# Patient Record
Sex: Female | Born: 1975 | Hispanic: No
Health system: Western US, Academic
[De-identification: ages and names within clinical notes are randomized; demographics above are authoritative.]

## PROBLEM LIST (undated history)

## (undated) DIAGNOSIS — E119 Type 2 diabetes mellitus without complications: Secondary | ICD-10-CM

## (undated) DIAGNOSIS — F329 Major depressive disorder, single episode, unspecified: Secondary | ICD-10-CM

## (undated) DIAGNOSIS — F32A Depression, unspecified: Secondary | ICD-10-CM

## (undated) HISTORY — DX: Type 2 diabetes mellitus without complications: E11.9

## (undated) HISTORY — DX: Depression, unspecified: F32.A

## (undated) HISTORY — PX: INDUCED ABORTION: SHX677

## (undated) HISTORY — DX: Major depressive disorder, single episode, unspecified: F32.9

---

## 2009-08-26 ENCOUNTER — Emergency Department (HOSPITAL_COMMUNITY): Admission: EM | Admit: 2009-08-26 | Discharge: 2009-08-26 | Payer: Self-pay | Admitting: Emergency Medicine

## 2011-03-01 LAB — POCT I-STAT, CHEM 8
BUN: 7 mg/dL (ref 6–23)
Calcium, Ion: 1.01 mmol/L — ABNORMAL LOW (ref 1.12–1.32)
Chloride: 108 mEq/L (ref 96–112)
Creatinine, Ser: 0.4 mg/dL (ref 0.4–1.2)
Glucose, Bld: 128 mg/dL — ABNORMAL HIGH (ref 70–99)
HCT: 39 % (ref 36.0–46.0)
Hemoglobin: 13.3 g/dL (ref 12.0–15.0)
Potassium: 3.9 mEq/L (ref 3.5–5.1)
Sodium: 139 mEq/L (ref 135–145)
TCO2: 21 mmol/L (ref 0–100)

## 2011-03-01 LAB — DIFFERENTIAL
Basophils Absolute: 0.1 10*3/uL (ref 0.0–0.1)
Basophils Relative: 1 % (ref 0–1)
Eosinophils Absolute: 0.1 10*3/uL (ref 0.0–0.7)
Eosinophils Relative: 2 % (ref 0–5)
Lymphocytes Relative: 45 % (ref 12–46)
Lymphs Abs: 2.5 10*3/uL (ref 0.7–4.0)
Monocytes Absolute: 0.5 10*3/uL (ref 0.1–1.0)
Monocytes Relative: 8 % (ref 3–12)
Neutro Abs: 2.5 10*3/uL (ref 1.7–7.7)
Neutrophils Relative %: 44 % (ref 43–77)
Smear Review: ADEQUATE

## 2011-03-01 LAB — URINALYSIS, ROUTINE W REFLEX MICROSCOPIC
Bilirubin Urine: NEGATIVE
Glucose, UA: NEGATIVE mg/dL
Hgb urine dipstick: NEGATIVE
Ketones, ur: NEGATIVE mg/dL
Nitrite: NEGATIVE
Protein, ur: NEGATIVE mg/dL
Specific Gravity, Urine: 1.029 (ref 1.005–1.030)
Urobilinogen, UA: 0.2 mg/dL (ref 0.0–1.0)
pH: 5.5 (ref 5.0–8.0)

## 2011-03-01 LAB — CBC
HCT: 36.9 % (ref 36.0–46.0)
Hemoglobin: 12 g/dL (ref 12.0–15.0)
MCHC: 32.5 g/dL (ref 30.0–36.0)
MCV: 74.9 fL — ABNORMAL LOW (ref 78.0–100.0)
Platelets: 455 10*3/uL — ABNORMAL HIGH (ref 150–400)
RBC: 4.92 MIL/uL (ref 3.87–5.11)
RDW: 17.3 % — ABNORMAL HIGH (ref 11.5–15.5)
WBC: 5.7 10*3/uL (ref 4.0–10.5)

## 2011-03-01 LAB — URINE MICROSCOPIC-ADD ON

## 2011-03-01 LAB — GLUCOSE, CAPILLARY
Glucose-Capillary: 124 mg/dL — ABNORMAL HIGH (ref 70–99)
Glucose-Capillary: 125 mg/dL — ABNORMAL HIGH (ref 70–99)

## 2011-03-01 LAB — POCT PREGNANCY, URINE: Preg Test, Ur: NEGATIVE

## 2015-04-21 ENCOUNTER — Encounter (HOSPITAL_COMMUNITY): Payer: Self-pay | Admitting: Emergency Medicine

## 2015-04-21 ENCOUNTER — Emergency Department (HOSPITAL_COMMUNITY)
Admission: EM | Admit: 2015-04-21 | Discharge: 2015-04-21 | Disposition: A | Discharge status: Home / Self Care | Payer: 59 | Attending: Emergency Medicine | Admitting: Emergency Medicine

## 2015-04-21 DIAGNOSIS — Z3202 Encounter for pregnancy test, result negative: Secondary | ICD-10-CM | POA: Diagnosis not present

## 2015-04-21 DIAGNOSIS — R739 Hyperglycemia, unspecified: Secondary | ICD-10-CM | POA: Diagnosis not present

## 2015-04-21 LAB — URINALYSIS, ROUTINE W REFLEX MICROSCOPIC
Bilirubin Urine: NEGATIVE
Glucose, UA: >1000 mg/dL — AB
Hgb urine dipstick: NEGATIVE
Ketones, ur: NEGATIVE mg/dL
Leukocytes, UA: NEGATIVE
Nitrite: NEGATIVE
Protein, ur: NEGATIVE mg/dL
Specific Gravity, Urine: 1.04 — ABNORMAL HIGH (ref 1.005–1.030)
Urobilinogen, UA: 0.2 mg/dL (ref 0.0–1.0)
pH: 5.5 (ref 5.0–8.0)

## 2015-04-21 LAB — COMPREHENSIVE METABOLIC PANEL
ALT: 19 U/L (ref 14–54)
AST: 23 U/L (ref 15–41)
Albumin: 4 g/dL (ref 3.5–5.0)
Alkaline Phosphatase: 102 U/L (ref 38–126)
Anion gap: 13 (ref 5–15)
BUN: 9 mg/dL (ref 6–20)
CO2: 24 mmol/L (ref 22–32)
Calcium: 9.3 mg/dL (ref 8.9–10.3)
Chloride: 100 mmol/L — ABNORMAL LOW (ref 101–111)
Creatinine, Ser: 0.72 mg/dL (ref 0.44–1.00)
GFR calc Af Amer: >60 mL/min (ref >60)
GFR calc non Af Amer: >60 mL/min (ref >60)
Glucose, Bld: 441 mg/dL — ABNORMAL HIGH (ref 65–99)
Potassium: 4 mmol/L (ref 3.5–5.1)
Sodium: 137 mmol/L (ref 135–145)
Total Bilirubin: 0.2 mg/dL — ABNORMAL LOW (ref 0.3–1.2)
Total Protein: 7.6 g/dL (ref 6.5–8.1)

## 2015-04-21 LAB — CBG MONITORING, ED
Glucose-Capillary: 442 mg/dL — ABNORMAL HIGH (ref 65–99)
Glucose-Capillary: 309 mg/dL — ABNORMAL HIGH (ref 65–99)

## 2015-04-21 LAB — CBC
HCT: 36.8 % (ref 36.0–46.0)
Hemoglobin: 11.6 g/dL — ABNORMAL LOW (ref 12.0–15.0)
MCH: 24.7 pg — ABNORMAL LOW (ref 26.0–34.0)
MCHC: 31.5 g/dL (ref 30.0–36.0)
MCV: 78.5 fL (ref 78.0–100.0)
Platelets: 361 10*3/uL (ref 150–400)
RBC: 4.69 MIL/uL (ref 3.87–5.11)
RDW: 14.6 % (ref 11.5–15.5)
WBC: 5.3 10*3/uL (ref 4.0–10.5)

## 2015-04-21 LAB — PREGNANCY, URINE: Preg Test, Ur: NEGATIVE

## 2015-04-21 LAB — URINE MICROSCOPIC-ADD ON

## 2015-04-21 MED ORDER — METFORMIN HCL 500 MG PO TABS
500.0000 mg | ORAL_TABLET | Freq: Once | ORAL | Status: AC
  Administered 2015-04-21: 500 mg via ORAL
  Filled 2015-04-21: qty 1

## 2015-04-21 MED ORDER — SODIUM CHLORIDE 0.9 % IV BOLUS (SEPSIS)
2000.0000 mL | Freq: Once | INTRAVENOUS | Status: AC
  Administered 2015-04-21: 2000 mL via INTRAVENOUS

## 2015-04-21 MED ORDER — METFORMIN HCL 500 MG PO TABS: 500.0000 mg | ORAL_TABLET | Freq: Two times a day (BID) | ORAL | Status: AC

## 2015-04-21 NOTE — ED Provider Notes (Signed)
CSN: [id number]     Arrival date & time 04/21/15  1010 History   First MD Initiated Contact with Patient 04/21/15 1035     Chief Complaint  Patient presents with  . Hyperglycemia    new      HPI Patient presents to the emergency department after being noted to have elevated blood sugar at the OB/GYN office today.  She states she was having a routine visit to get birth control pills and described her intermittent lightheadedness as well as polyuria and polydipsia.  Blood sugar was noted to be greater than 400 was sent to the ER for evaluation.  No prior history of diabetes.  She does admit to polyuria and polydipsia.  No fevers or chills.  No chest pain.  Denies nausea vomiting.  No diarrhea.  No urinary complaints.   History reviewed. No pertinent past medical history. Past Surgical History  Procedure Laterality Date  . Induced abortion     History reviewed. No pertinent family history. History  Substance Use Topics  . Smoking status: Never Smoker   . Smokeless tobacco: Not on file  . Alcohol Use: No   OB History    No data available     Review of Systems  All other systems reviewed and are negative.     Allergies  Review of patient's allergies indicates no known allergies.  Home Medications   Prior to Admission medications   Not on File   BP 126/87 mmHg  Pulse 89  Temp(Src) 97.5 F (36.4 C) (Oral)  Resp 19  SpO2 96%  LMP 03/31/2015 (Exact Date) Physical Exam  Constitutional: She is oriented to person, place, and time. She appears well-developed and well-nourished. No distress.  HENT:  Head: Normocephalic and atraumatic.  Eyes: EOM are normal.  Neck: Normal range of motion.  Cardiovascular: Normal rate, regular rhythm and normal heart sounds.   Pulmonary/Chest: Effort normal and breath sounds normal.  Abdominal: Soft. She exhibits no distension. There is no tenderness.  Musculoskeletal: Normal range of motion.  Neurological: She is alert and oriented to  person, place, and time.  Skin: Skin is warm and dry.  Psychiatric: She has a normal mood and affect. Judgment normal.  Nursing note and vitals reviewed.   ED Course  Procedures (including critical care time) Labs Review Labs Reviewed  CBC - Abnormal; Notable for the following:    Hemoglobin 11.6 (*)    MCH 24.7 (*)    All other components within normal limits  COMPREHENSIVE METABOLIC PANEL - Abnormal; Notable for the following:    Chloride 100 (*)    Glucose, Bld 441 (*)    Total Bilirubin 0.2 (*)    All other components within normal limits  URINALYSIS, ROUTINE W REFLEX MICROSCOPIC (NOT AT [facility]) - Abnormal; Notable for the following:    Specific Gravity, Urine 1.040 (*)    Glucose, UA >1000 (*)    All other components within normal limits  CBG MONITORING, ED - Abnormal; Notable for the following:    Glucose-Capillary 442 (*)    All other components within normal limits  CBG MONITORING, ED - Abnormal; Notable for the following:    Glucose-Capillary 309 (*)    All other components within normal limits  PREGNANCY, URINE  URINE MICROSCOPIC-ADD ON  HEMOGLOBIN A1C    Imaging Review No results found.   EKG Interpretation None      MDM   Final diagnoses:  None   Elevated blood sugar.  Hemoglobin A1c sent.  She will need a primary care physician.  I will start the patient on metformin in the meantime.  Blood sugar improved with IV fluids.  She's feeling better after IV fluids.  Patient understands to return to the emergency department for new or worsening symptoms     Jola Schmidt, MD 04/21/15 1356

## 2015-04-21 NOTE — Discharge Instructions (Signed)
High Blood Sugar High blood sugar (hyperglycemia) means that the level of sugar in your blood is higher than it should be. Signs of high blood sugar include:  Feeling thirsty.  Frequent peeing (urinating).  Feeling tired or sleepy.  Dry mouth.  Vision changes.  Feeling weak.  Feeling hungry but losing weight.  Numbness and tingling in your hands or feet.  Headache. When you ignore these signs, your blood sugar may keep going up. These problems may get worse, and other problems may begin. HOME CARE  Check your blood sugars as told by your doctor. Write down the numbers with the date and time.  Take the right amount of insulin or diabetes pills at the right time. Write down the dose with date and time.  Refill your insulin or diabetes pills before running out.  Watch what you eat. Follow your meal plan.  Drink liquids without sugar, such as water. Check with your doctor if you have kidney or heart disease.  Follow your doctor's orders for exercise. Exercise at the same time of day.  Keep your doctor's appointments. GET HELP RIGHT AWAY IF:   You have trouble thinking or are confused.  You have fast breathing with fruity smelling breath.  You pass out (faint).  You have 2 to 3 days of high blood sugars and you do not know why.  You have chest pain.  You are feeling sick to your stomach (nauseous) or throwing up (vomiting).  You have sudden vision changes. MAKE SURE YOU:   Understand these instructions.  Will watch your condition.  Will get help right away if you are not doing well or get worse. Document Released: 09/09/2009 Document Revised: 02/04/2012 Document Reviewed: 09/09/2009 [facility] Patient Information 2015 [place], [place]. This information is not intended to replace advice given to you by your health care provider. Make sure you discuss any questions you have with your health care provider.  Emergency Department Resource Guide 1) Find a Doctor and  Pay Out of Pocket Although you won't have to find out who is covered by your insurance plan, it is a good idea to ask around and get recommendations. You will then need to call the office and see if the doctor you have chosen will accept you as a new patient and what types of options they offer for patients who are self-pay. Some doctors offer discounts or will set up payment plans for their patients who do not have insurance, but you will need to ask so you aren't surprised when you get to your appointment.  2) Contact Your Local Health Department Not all health departments have doctors that can see patients for sick visits, but many do, so it is worth a call to see if yours does. If you don't know where your local health department is, you can check in your phone book. The CDC also has a tool to help you locate your state's health department, and many state websites also have listings of all of their local health departments.  3) Find a [facility] If your illness is not likely to be very severe or complicated, you may want to try a walk in clinic. These are popping up all over the country in pharmacies, drugstores, and shopping centers. They're usually staffed by nurse practitioners or physician assistants that have been trained to treat common illnesses and complaints. They're usually fairly quick and inexpensive. However, if you have serious medical issues or chronic medical problems, these are probably not your best  option.  No Primary Care Doctor: - Call Health Connect at  [phone] - they can help you locate a primary care doctor that  accepts your insurance, provides certain services, etc. - Physician Referral Service- [phone]  Chronic Pain Problems: Organization         Address  Phone   Notes  [facility]  [phone] Patients need to be referred by their primary care doctor.   Medication Assistance: Organization         Address  Phone   Notes  [facility] Medication [facility] [address] [place], [place] [address] [phone] --Must be a resident of [facility] -- Must have NO insurance coverage whatsoever (no Medicaid/ Medicare, etc.) -- The pt. MUST have a primary care doctor that directs their care regularly and follows them in the community   MedAssist  [phone]   [employer]  [phone]    Agencies that provide inexpensive medical care: Organization         Address  Phone   Notes  [place]  [phone]   [facility]    [phone]   [facility], [place] [address] [phone]   [place] [address] [place] [phone]   Planned Parenthood    [phone]   [facility]    [phone]   [place] and [address], [place] Phone:  [phone], Fax:  [phone] Hours of Operation:  9 am - 6 pm, M-F.  Also accepts Medicaid/Medicare and self-pay.  [facility] for [place] [address], [address], [place] Phone: [phone], Fax: [phone]. Hours of Operation:  8:30 am - 5:30 pm, M-F.  Also accepts Medicaid and self-pay.  [facility] [place] [address], [place] Phone: [phone]   [place], [place], [place] [phone], Ext. [phone] Mondays & Thursdays: 7-9 AM.  First 15 patients are seen on a first come, first serve basis.    [place] Providers:  Organization         Address  Phone   Notes  [facility] [address], [place] [phone] Also accepts self-pay patients.  [facility] [address] [place], [place]  [phone]   [place], [address], [place] [phone]   [facility] [address] [place] [phone]   Lucianne Lei [address], [place]   [phone] Only accepts [place] Access [place] patients after they have their name applied to their card.   Self-Pay (no insurance) in [facility]:  Organization         Address  Phone   Notes  Sickle Cell Patients, [facility] Internal [facility] [place] [phone]   [facility] [place] [phone]   [facility] [place]  [place], [place], [place] [phone]   [facility]/Dr. Osei-Bonsu  [address] [place] or [address], [place] [phone] Phone number for both [place] and [place] locations is the same.  [facility] [address], [place] (  [place]) [phone]   [place], [place] or [address] [phone]   [facility] [address] [place] [phone], phone; [phone], fax Sees patients 1st and 3rd Saturday of every month.  Must not qualify for public or private insurance (i.e. Medicaid, Medicare, [place] Health Choice, Veterans' Benefits)  Household income should be no more than 200% of the poverty level The clinic cannot treat you if you are pregnant or think you are pregnant  Sexually transmitted diseases are not treated at the clinic.    Dental Care: Organization         Address  Phone  Notes  [facility] Department of [facility] [place] [phone] Accepts children up to age 45 who are enrolled in [place] or [place]; pregnant women with a Medicaid card; and children who have applied for Medicaid or [place] Health Choice, but were declined, whose parents can pay a reduced fee at time of service.  [facility] Department of [facility], [place]  [address], [place] [phone] Accepts children up to age 41 who are enrolled in [place] or [place]; pregnant women with a Medicaid card; and children who have applied for Medicaid or [place] Health Choice, but were declined, whose parents can pay a reduced fee at time of service.  [place] Adult Dental Access PROGRAM  [place] [phone] Patients are seen by appointment only. Walk-ins are not accepted. [place] will see patients 21 years of age and older. Monday - Tuesday (8am-5pm) Most Wednesdays (8:30-5pm) $30 per visit, cash only  [facility] Adult Dental Access PROGRAM  [address], [facility] [phone] Patients are seen by appointment only. Walk-ins are not accepted. [place] will see patients 60 years of age and older. One Wednesday Evening (Monthly: Volunteer Based).  $30 per visit, cash only  [place]  [phone] for adults; Children under age 70, call Graduate Pediatric Dentistry at [phone]. Children aged 14-14, please call [phone] to request a pediatric application.  Dental services are provided in all areas of dental care including fillings, crowns and bridges, complete and partial dentures, implants, gum treatment, root canals, and extractions. Preventive care is also provided. Treatment is provided to both adults and children. Patients are selected via a lottery and there is often a waiting list.   [facility] [address] [place]  [phone] [email or web address]   [facility] [address] [place], [place] [phone], Ext. [phone] Second and Fourth Thursday of each month, opens at 6:30 AM; Clinic ends at 9 AM.  Patients are seen on a first-come first-served basis, and a limited number are seen during each clinic.   [facility]  [address] Hillard Danker [place], [place] [phone]   Eligibility Requirements You must have lived in [place], [place], or  [place] counties for at least the last three months.   You cannot be eligible for state or federal sponsored [employer], including [employer], [place], or [employer].   You generally cannot be eligible for healthcare insurance through your employer.    How to apply: Eligibility screenings are held every Tuesday and Wednesday afternoon from 1:00 pm until 4:00 pm. You do not need an appointment for the interview!  [facility] [address], [place],  [place] Department  [place] Department  [place]  [phone]    Behavioral Health Resources in the Community: Intensive Outpatient Programs Organization         Address  Phone  Notes  [place]. [address], [place], [place] [phone]   [facility] Outpatient [address] [place], [place]   ADS: Alcohol & Drug Svcs [address] [place], [place]   [place] [address]  [place], [place] or [phone]   Substance Abuse Resources Organization         Address  Phone  Notes  Alcohol and Drug Services  [phone]   [place]  [phone]   [place]   Chinita Pester  [phone]   Residential & Outpatient Substance Abuse Program  [phone]   Psychological Services Organization         Address  Phone  Notes  [facility] [place]  [place]  [phone]   [place] [address] [place] or [phone]    Mobile Crisis Teams Organization         Address  Phone  Notes  [facility], Mobile Crisis Care Unit  [phone]   [facility]  [address] [place], [place]   [place] [address] [place] [phone]    Self-Help/Support Groups Organization         Address  Phone             Notes  [place]. of [place] - variety of support groups  [place] Call for more information  Narcotics Anonymous (NA), [facility] [address], [employer] [place]  2 meetings at this location   Special educational needs teacher         Address  Phone  Notes  [facility] Residential Treatment [place],    [place]  [phone]   [facility]  [address], [place] [phone], [place], [place]   [place], [place] [phone] Admissions: 8am-3pm M-F  Incentives Substance [place] [address],    [place], [place] [phone]   [facility] [address] [place], [place], [place]   [facility] [address]  [place], [place]   [facility]ight Programs - Intensive Outpatient [address] Kristeen [address], [place], [place]   [facility] ([place].,  [place], [place] [phone] or [phone]   [facility] Services (RTS) [address], [place], [place] Accepts Medicaid  [facility] [place] [address],  [place] [phone] Substance Abuse/Addiction Treatment   [facility] [place] [facility] Organization         Address  Phone  Notes  [facility]  [phone]   Domenic Schwab, PhD [address] Arlis Porta [place], [place]   [phone] or [phone]   [place], [place] [phone]   [place] [address] [place], [place] [phone] Insurance/Medicaid/sponsorship through [employer] and Families [address] [place] [address]  [place], [place] [phone] [place], [place] [phone]     Dr. Adele Schilder  [phone]   [facility] [place] Dept. 1) [address] [place] 2) [place] 3)  [place] [address], [place] [phone]  [phone]   [place] [phone] or [phone] (After Hours)

## 2015-04-21 NOTE — ED Notes (Signed)
Per EMS- was at [facility] office and CBG was found to be 431 mg/dl. Wanted patient transported to nearest hospital. Patient has never been diagnosed with DM. Recent issues with dizziness. VSS. BP 150/90 HR 72 RR 18.

## 2015-04-21 NOTE — ED Notes (Signed)
Bed: WA17 Expected date:  Expected time:  Means of arrival:  Comments: EMS/hyperglycemia 

## 2015-04-21 NOTE — ED Notes (Signed)
RN STARTING IV AND DRAWING LABS PER RN

## 2015-04-22 ENCOUNTER — Telehealth (HOSPITAL_BASED_OUTPATIENT_CLINIC_OR_DEPARTMENT_OTHER): Payer: Self-pay | Admitting: Emergency Medicine

## 2015-04-22 LAB — HEMOGLOBIN A1C
Hgb A1c MFr Bld: 12.8 % — ABNORMAL HIGH (ref 4.8–5.6)
Mean Plasma Glucose: 321 mg/dL

## 2015-05-12 ENCOUNTER — Encounter: Payer: 59 | Attending: Endocrinology

## 2015-05-12 VITALS — Ht 67.0 in | Wt 248.3 lb

## 2015-05-12 DIAGNOSIS — E119 Type 2 diabetes mellitus without complications: Secondary | ICD-10-CM

## 2015-05-12 DIAGNOSIS — Z713 Dietary counseling and surveillance: Secondary | ICD-10-CM | POA: Insufficient documentation

## 2015-05-12 NOTE — Progress Notes (Signed)
Patient was seen on 05/12/15 for the first of a series of three diabetes self[facility]management [facility]  Patient Education Plan per assessed needs and concerns is to attend four course education program for Diabetes Self Management Education.  The following learning objectives were met by the patient during this class:  Describe diabetes  State some common risk factors for diabetes  Defines the role of glucose and insulin  Identifies type of diabetes and pathophysiology  Describe the relationship between diabetes and cardiovascular risk  State the members of the Healthcare Team  States the rationale for glucose monitoring  State when to test glucose  State their individual Target Range  State the importance of logging glucose readings  Describe how to interpret glucose readings  Identifies A1C target  Explain the correlation between A1c and eAG values  State symptoms and treatment of high blood glucose  State symptoms and treatment of low blood glucose  Explain proper technique for glucose testing  Identifies proper sharps disposal  Handouts given during class include:  Living Well with Diabetes book  Carb Counting and Meal Planning book  Meal Plan Card  Carbohydrate guide  Meal planning worksheet  Low Sodium Flavoring Tips  The diabetes portion plate  X5F to eAG Conversion Chart  Diabetes Medications  Diabetes Recommended Care Schedule  Support Group  Diabetes Success Plan  Core Class Satisfaction Survey  Follow-Up Plan:  Attend core 2

## 2015-05-19 DIAGNOSIS — E119 Type 2 diabetes mellitus without complications: Secondary | ICD-10-CM

## 2015-05-19 NOTE — Progress Notes (Signed)

## 2015-05-26 DIAGNOSIS — E119 Type 2 diabetes mellitus without complications: Secondary | ICD-10-CM

## 2015-05-31 NOTE — Progress Notes (Signed)
  Patient was seen on 05/26/2015 for the third of a series of three diabetes self-management courses [facility]. The following learning objectives were met by the patient during this course:  . Describe how diabetes changes over time . Identify diabetes complications and ways to prevent them . Describe strategies that can promote heart health including lowering blood pressure and cholesterol . Describe strategies to lower dietary fat and sodium in the diet . Identify physical activities that benefit cardiovascular health . Evaluate success in meeting personal goal . Describe the belief that they can live successfully with diabetes day to day . Establish 2-3 goals that they will plan to diligently work on until they return for the free 66-monthfollow-up visit  The following handouts were given in class:  3 Month Follow Up Visit handout  Goal setting handout  Class evaluation form  Your patient has established the following 3 month goals for diabetes self-care:  Count Carbohydrates for meals and snacks  Be active fore 30 minutes or more 3 x week  Reduce my intake of unhealthy fat by eating less fast food  Follow-Up Plan: Patient will attend a 3 month follow-up visit for diabetes self-management education.

## 2017-11-26 DIAGNOSIS — C50919 Malignant neoplasm of unspecified site of unspecified female breast: Secondary | ICD-10-CM

## 2017-11-26 HISTORY — DX: Malignant neoplasm of unspecified site of unspecified female breast: C50.919

## 2018-06-02 ENCOUNTER — Other Ambulatory Visit (HOSPITAL_COMMUNITY): Payer: Self-pay | Admitting: *Deleted

## 2018-06-02 DIAGNOSIS — N6310 Unspecified lump in the right breast, unspecified quadrant: Secondary | ICD-10-CM

## 2018-06-24 ENCOUNTER — Other Ambulatory Visit (HOSPITAL_COMMUNITY): Payer: Self-pay | Admitting: Obstetrics and Gynecology

## 2018-06-24 ENCOUNTER — Ambulatory Visit
Admission: RE | Admit: 2018-06-24 | Discharge: 2018-06-24 | Disposition: A | Discharge status: Home / Self Care | Payer: 59 | Source: Ambulatory Visit | Attending: Obstetrics and Gynecology | Admitting: Obstetrics and Gynecology

## 2018-06-24 ENCOUNTER — Encounter (HOSPITAL_COMMUNITY): Payer: Self-pay

## 2018-06-24 ENCOUNTER — Ambulatory Visit
Admission: RE | Admit: 2018-06-24 | Discharge: 2018-06-24 | Disposition: A | Discharge status: Home / Self Care | Payer: PRIVATE HEALTH INSURANCE | Source: Ambulatory Visit | Attending: Obstetrics and Gynecology | Admitting: Obstetrics and Gynecology

## 2018-06-24 ENCOUNTER — Ambulatory Visit (HOSPITAL_COMMUNITY)
Admission: RE | Admit: 2018-06-24 | Discharge: 2018-06-24 | Disposition: A | Discharge status: Home / Self Care | Payer: Self-pay | Source: Ambulatory Visit | Attending: Obstetrics and Gynecology | Admitting: Obstetrics and Gynecology

## 2018-06-24 ENCOUNTER — Ambulatory Visit
Admission: RE | Admit: 2018-06-24 | Discharge: 2018-06-24 | Disposition: A | Discharge status: Home / Self Care | Payer: Self-pay | Source: Ambulatory Visit | Attending: Obstetrics and Gynecology | Admitting: Obstetrics and Gynecology

## 2018-06-24 VITALS — BP 114/76 | Ht 67.0 in

## 2018-06-24 DIAGNOSIS — Z1239 Encounter for other screening for malignant neoplasm of breast: Secondary | ICD-10-CM

## 2018-06-24 DIAGNOSIS — N6310 Unspecified lump in the right breast, unspecified quadrant: Secondary | ICD-10-CM

## 2018-06-24 DIAGNOSIS — N6311 Unspecified lump in the right breast, upper outer quadrant: Secondary | ICD-10-CM

## 2018-06-24 NOTE — Progress Notes (Signed)
Complaints of a right breast lump x 3 weeks.  Pap Smear: Pap smear not completed today. Last [facility] smear was in 2017 at [facility] and normal per patient. Per patient has no history of an abnormal Pap smear. No Pap smear results are in Epic.  Physical exam: Breasts Left breast slightly larger than right breast. No skin abnormalities bilateral breasts. No nipple retraction bilateral breasts. No nipple discharge bilateral breasts. No lymphadenopathy. No lumps palpated left breast. Palpated a lump within the right breast at 10 o'clock 7 cm from the nipple. No complaints of pain or tenderness on exam. Referred patient to the [facility] for a diagnostic mammogram and right breast ultrasound. Appointment scheduled for Tuesday, June 24, 2018 at 1410.        Pelvic/Bimanual No Pap smear completed today since last Pap smear was in 2017 per patient. Pap smear not indicated per BCCCP guidelines.   Smoking History: Patient has never smoked.  Patient Navigation: Patient education provided. Access to services provided for patient through BCCCP program.   Breast and Cervical Cancer Risk Assessment: Patient has no family history of breast cancer, known genetic mutations, or radiation treatment to the chest before age 69. Patient has no history of cervical dysplasia, immunocompromised, or DES exposure in-utero.  Risk Assessment    Risk Scores      06/24/2018   Last edited by: Armond Hang, LPN   5-year risk: 0.6 %   Lifetime risk: 9.6 %

## 2018-06-24 NOTE — Patient Instructions (Signed)
Explained breast self awareness with Paula Libra. Patient did not need a Pap smear today due to last Pap smear was in 2017 per patient. Let her know BCCCP will cover Pap smears every 3 years unless has a history of abnormal Pap smears. Reminded patient that her next Pap smear will be due next year and that it is covered through [employer]. Told patient to call BCCCP to schedule. Referred patient to the [facility] for a diagnostic mammogram and right breast ultrasound. Appointment scheduled for Tuesday, June 24, 2018 at 1410. Kesley Mullens verbalized understanding.  Shantay Sonn, [name], RN 1:45 PM

## 2018-06-25 ENCOUNTER — Telehealth: Payer: Self-pay | Admitting: Oncology

## 2018-06-25 NOTE — Telephone Encounter (Signed)
Spoke with patient to confirm afternoon [facility] clinic for 8/7, packet emailed to patient

## 2018-06-26 ENCOUNTER — Encounter: Payer: Self-pay | Admitting: *Deleted

## 2018-06-27 ENCOUNTER — Other Ambulatory Visit: Payer: Self-pay | Admitting: *Deleted

## 2018-06-27 ENCOUNTER — Encounter (HOSPITAL_COMMUNITY): Payer: Self-pay | Admitting: *Deleted

## 2018-06-27 DIAGNOSIS — Z171 Estrogen receptor negative status [ER-]: Principal | ICD-10-CM

## 2018-06-27 DIAGNOSIS — C50411 Malignant neoplasm of upper-outer quadrant of right female breast: Secondary | ICD-10-CM

## 2018-06-30 ENCOUNTER — Other Ambulatory Visit (HOSPITAL_COMMUNITY): Payer: Self-pay | Admitting: *Deleted

## 2018-06-30 DIAGNOSIS — Z0000 Encounter for general adult medical examination without abnormal findings: Secondary | ICD-10-CM

## 2018-07-01 NOTE — Addendum Note (Signed)
Addended by: Tasia Catchings R on: 07/01/2018 12:46 PM   Modules accepted: Orders

## 2018-07-01 NOTE — Addendum Note (Signed)
Addended by: Tasia Catchings R on: 07/01/2018 12:45 PM   Modules accepted: Orders

## 2018-07-01 NOTE — Progress Notes (Signed)
[place]  [phone] [phone]     ID: Tara Gomez DOB: [birth year]  MR#: [id number]  FWY#:[id number]  Patient Care Team: Marylynn Pearson, MD as PCP - General (Obstetrics and Gynecology) Erroll Luna, MD as Consulting Physician (General Surgery) Lanier Felty, Virgie Dad, MD as Consulting Physician (Oncology) Kyung Rudd, MD as Consulting Physician (Radiation Oncology) OTHER MD:  CHIEF COMPLAINT: Estrogen receptor negative breast cancer  CURRENT TREATMENT: Neoadjuvant chemotherapy   HISTORY OF CURRENT ILLNESS: Tara Gomez palpated a mass in her right breast in early July 2019. She brought it to medical attention and underwent bilateral diagnostic mammography with tomography and right breast ultrasonography at [facility] on 06/24/2018 showing: breast density category B. There is a hypoechoic mass at the 10 o' clock right breast upper outer quadrant located 7 cm from the nipple. There were no suspicious lymph nodes in the right axilla.   Accordingly on 06/24/2018 she proceeded to biopsy of the right breast area in question. The pathology from this procedure showed ([id number]): Invasive ductal carcinoma, grade III. Prognostic indicators significant for: both estrogen receptor and progesterone receptor, 0% negative. Proliferation marker Ki67 at 80%. HER2 amplified  with ratios HER2/CEP17 signals 3.28 and average HER2 copies per cell 4.10  The patient's subsequent history is as detailed below.  INTERVAL HISTORY: Tara Gomez was evaluated in the multidisciplinary breast cancer clinic on 07/02/2018. Her case was also presented at the multidisciplinary breast cancer conference on the same day. At that time a preliminary plan was proposed: Neoadjuvant chemotherapy and immunotherapy followed by breast conserving surgery and sentinel lymph node sampling and radiation, genetics testing.   REVIEW OF SYSTEMS: She notes that she is taking 10,000 mcg of biotin due  to dysmenorrhea. She notes that the biotin prevents her from having significant perimenstrual pain. She also followed up with another doctor for her diabetes, but she has not seen that doctor for many years. She goes to [employer] for exercise. The patient denies unusual headaches, visual changes, nausea, vomiting, stiff neck, dizziness, or gait imbalance. There has been no cough, phlegm production, or pleurisy, no chest pain or pressure, and no change in bowel or bladder habits. The patient denies fever, rash, bleeding, unexplained fatigue or unexplained weight loss. A detailed review of systems was otherwise entirely negative.    PAST MEDICAL HISTORY: Past Medical History:  Diagnosis Date  . Depression   . Diabetes mellitus without complication ([place])   Dysmenorrhea   PAST SURGICAL HISTORY: Past Surgical History:  Procedure Laterality Date  . INDUCED ABORTION      FAMILY HISTORY Family History  Problem Relation Age of Onset  . Diabetes Mother   . Hypertension Mother   . Diabetes Paternal Grandmother   . Breast cancer Neg Hx   The patient's father died at age 46 due to liver cancer. The patient's mother is alive at age 54 as of August 2019. The patient has 1 brother and 4 sisters. The patient denies a family history of breast or ovarian cancer in the family.   GYNECOLOGIC HISTORY:  Patient's last menstrual period was 06/17/2018 (approximate). Menarche: 42 years old The patient has not carried a child to term.  She is having regular monthly periods that last 3 days and are not heavy, if she takes biotin. She used ortho tri-cyclen for birth control.     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 The patient's SO  recently passed away from renal failure.  Currently at home is the patient's sister visiting from [place] state and their mother who moved from [place]. The patient notes that this is a  temporary arrangement.  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 The patient's mother can be reached at [phone].   HEALTH MAINTENANCE: Social History   Tobacco Use  . Smoking status: Never Smoker  . Smokeless tobacco: Never Used  Substance Use Topics  . Alcohol use: No  . Drug use: Not Currently     Colonoscopy:  PAP: December 2016  Bone density:   No Known Allergies  Current Outpatient Medications  Medication Sig Dispense Refill  . metFORMIN (GLUCOPHAGE) 500 MG tablet Take 1 tablet (500 mg total) by mouth 2 (two) times daily with a meal. (Patient not taking: Reported on 06/24/2018) 60 tablet 0   No current facility-administered medications for this visit.     OBJECTIVE: Morbidly obese African-American woman who appears stated age  42:   07/02/18 1248  BP: 115/77  Pulse: 72  Resp: 18  Temp: 98.5 F (36.9 C)  SpO2: 100%     Body mass index is 40.53 kg/m.   Wt Readings from Last 3 Encounters:  07/02/18 258 lb 12.8 oz (117.4 kg)  05/12/15 248 lb 4.8 oz (112.6 kg)      ECOG FS:1 - Symptomatic but completely ambulatory  Ocular: Sclerae unicteric, pupils round and equal Ear-nose-throat: Oropharynx clear and moist Lymphatic: No cervical or supraclavicular adenopathy Lungs no rales or rhonchi Heart regular rate and rhythm Abd soft, obese, nontender, positive bowel sounds MSK no focal spinal tenderness, no joint edema Neuro: non-focal, well-oriented, appropriate affect Breasts: I palpate an area of irregularity in the upper outer quadrant of the right breast which is not well-defined today.  There are no skin or nipple changes of concern.  The left breast is benign.  Both axillae are benign.   LAB RESULTS:  CMP     Component Value Date/Time   NA 139 07/02/2018 1218   K 4.1 07/02/2018 1218   CL 103 07/02/2018 1218    CO2 25 07/02/2018 1218   GLUCOSE 226 (H) 07/02/2018 1218   BUN 7 07/02/2018 1218   CREATININE 0.85 07/02/2018 1218   CALCIUM 8.7 (L) 07/02/2018 1218   PROT 7.0 07/02/2018 1218   ALBUMIN 3.4 (L) 07/02/2018 1218   AST 17 07/02/2018 1218   ALT 13 07/02/2018 1218   ALKPHOS 88 07/02/2018 1218   BILITOT 0.3 07/02/2018 1218   GFRNONAA >60 07/02/2018 1218   GFRAA >60 07/02/2018 1218    No results found for: TOTALPROTELP, ALBUMINELP, A1GS, A2GS, BETS, BETA2SER, GAMS, MSPIKE, SPEI  No results found for: KPAFRELGTCHN, LAMBDASER, KAPLAMBRATIO  Lab Results  Component Value Date   WBC 6.1 07/02/2018   NEUTROABS 3.2 07/02/2018   HGB 11.3 (L) 07/02/2018   HCT 35.6 07/02/2018   MCV 81.3 07/02/2018   PLT 312 07/02/2018    _0 @  No results found for: LABCA2  No components found for: [id number]  No results for input(s): INR in the last 168 hours.  No results found for: LABCA2  No results found for: WGN562  No results found for: ZHY865  No results found for: HQI696  No results found for: CA2729  No components found for: HGQUANT  No results found for: CEA1 /  No results found for: CEA1   No results found for: AFPTUMOR  No results found for: [place]  No results found for: PSA1  Appointment on 07/02/2018  Component Date Value Ref Range Status  . Cholesterol 07/02/2018 154  0 - 200 mg/dL Final  . Triglycerides 07/02/2018 171* <150 mg/dL Final  . HDL 07/02/2018 52  >40 mg/dL Final  . Total CHOL/HDL Ratio 07/02/2018 3.0  RATIO Final  . VLDL 07/02/2018 34  0 - 40 mg/dL Final  . LDL Cholesterol 07/02/2018 68  0 - 99 mg/dL Final   Comment:        Total Cholesterol/HDL:CHD Risk Coronary Heart Disease Risk Table                     Men   Women  1/2 Average Risk   3.4   3.3  Average Risk       5.0   4.4  2 X Average Risk   9.6   7.1  3 X Average Risk  23.4   11.0        Use the calculated Patient Ratio above and the CHD Risk Table to determine the patient's  CHD Risk.        ATP III CLASSIFICATION (LDL):  <100     mg/dL   Optimal  100-129  mg/dL   Near or Above                    Optimal  130-159  mg/dL   Borderline  160-189  mg/dL   High  >190     mg/dL   Very High Performed at [facility] [address] [place], [place] [address]   . Hgb A1c MFr Bld 07/02/2018 8.9* 4.8 - 5.6 % Final   Comment: (NOTE) Pre diabetes:          5.7%-6.4% Diabetes:              >6.4% Glycemic control for   <7.0% adults with diabetes   . Mean Plasma Glucose 07/02/2018 208.73  mg/dL Final   Performed at [facility] [address], [place], [place] [address]  . Sodium 07/02/2018 139  135 - 145 mmol/L Final  . Potassium 07/02/2018 4.1  3.5 - 5.1 mmol/L Final  . Chloride 07/02/2018 103  98 - 111 mmol/L Final  . CO2 07/02/2018 25  22 - 32 mmol/L Final  . Glucose, Bld 07/02/2018 226* 70 - 99 mg/dL Final  . BUN 07/02/2018 7  6 - 20 mg/dL Final  . Creatinine 07/02/2018 0.85  0.44 - 1.00 mg/dL Final  . Calcium 07/02/2018 8.7* 8.9 - 10.3 mg/dL Final  . Total Protein 07/02/2018 7.0  6.5 - 8.1 g/dL Final  . Albumin 07/02/2018 3.4* 3.5 - 5.0 g/dL Final  . AST 07/02/2018 17  15 - 41 U/L Final  . ALT 07/02/2018 13  0 - 44 U/L Final  . Alkaline Phosphatase 07/02/2018 88  38 - 126 U/L Final  . Total Bilirubin 07/02/2018 0.3  0.3 - 1.2 mg/dL Final  . GFR, Est Non Af Am 07/02/2018 >60  >60 mL/min Final  . GFR, Est AFR Am 07/02/2018 >60  >60 mL/min Final   Comment: (NOTE) The eGFR has been calculated using the CKD EPI equation. This calculation has not been validated in all clinical situations. eGFR's persistently <60 mL/min signify possible Chronic Kidney Disease.   . Anion gap 07/02/2018 11  5 - 15 Final  Performed at [facility], [place] [address] [place], [place] [address]  . WBC Count 07/02/2018 6.1  3.9 - 10.3 K/uL Final  . RBC 07/02/2018 4.38  3.70 - 5.45 MIL/uL Final  . Hemoglobin 07/02/2018 11.3* 11.6 -  15.9 g/dL Final  . HCT 07/02/2018 35.6  34.8 - 46.6 % Final  . MCV 07/02/2018 81.3  79.5 - 101.0 fL Final  . MCH 07/02/2018 25.8  25.1 - 34.0 pg Final  . MCHC 07/02/2018 31.7  31.5 - 36.0 g/dL Final  . RDW 07/02/2018 14.4  11.2 - 14.5 % Final  . Platelet Count 07/02/2018 312  145 - 400 K/uL Final  . Neutrophils Relative % 07/02/2018 52  % Final  . Neutro Abs 07/02/2018 3.2  1.5 - 6.5 K/uL Final  . Lymphocytes Relative 07/02/2018 36  % Final  . Lymphs Abs 07/02/2018 2.2  0.9 - 3.3 K/uL Final  . Monocytes Relative 07/02/2018 9  % Final  . Monocytes Absolute 07/02/2018 0.5  0.1 - 0.9 K/uL Final  . Eosinophils Relative 07/02/2018 3  % Final  . Eosinophils Absolute 07/02/2018 0.2  0.0 - 0.5 K/uL Final  . Basophils Relative 07/02/2018 0  % Final  . Basophils Absolute 07/02/2018 0.0  0.0 - 0.1 K/uL Final   Performed at [facility], [address] [place], [place] [address]    (this displays the last labs from the last 3 days)  No results found for: TOTALPROTELP, ALBUMINELP, A1GS, A2GS, BETS, BETA2SER, GAMS, MSPIKE, SPEI (this displays SPEP labs)  No results found for: KPAFRELGTCHN, LAMBDASER, KAPLAMBRATIO (kappa/lambda light chains)  No results found for: HGBA, HGBA2QUANT, HGBFQUANT, HGBSQUAN (Hemoglobinopathy evaluation)   No results found for: LDH  No results found for: IRON, TIBC, IRONPCTSAT (Iron and TIBC)  No results found for: FERRITIN  Urinalysis    Component Value Date/Time   COLORURINE YELLOW 04/21/2015 [place] 04/21/2015 1039   LABSPEC 1.040 (H) 04/21/2015 1039   PHURINE 5.5 04/21/2015 1039   GLUCOSEU >1000 (A) 04/21/2015 1039   HGBUR NEGATIVE 04/21/2015 [place] 04/21/2015 1039   KETONESUR NEGATIVE 04/21/2015 [place] 04/21/2015 1039   UROBILINOGEN 0.2 04/21/2015 1039   NITRITE NEGATIVE 04/21/2015 [place] 04/21/2015 1039     STUDIES: US Breast Ltd Uni Right  Inc Axilla  Result Date: 06/24/2018 CLINICAL DATA:  RIGHT breast lump noted 3 weeks ago. EXAM: DIGITAL DIAGNOSTIC BILATERAL MAMMOGRAM WITH CAD AND TOMO ULTRASOUND RIGHT BREAST COMPARISON:  05/16/2015 ACR Breast Density Category b: There are scattered areas of fibroglandular density. FINDINGS: There is an indistinct mass in the UPPER-OUTER QUADRANT of the RIGHT breast, marked as palpable with BB. LEFT breast is negative. Mammographic images were processed with CAD. On physical exam, I palpate a discrete mass in the 10 o'clock location of the RIGHT breast 7 centimeters from the nipple. Targeted ultrasound is performed, showing irregular hypoechoic mass with hyperechoic margins and significant internal vascularity in the 10 o'clock location 7 centimeters from the RIGHT nipple. There are mixed posterior acoustic features. Evaluation of the RIGHT axilla there is multiple mildly prominent lymph nodes which are symmetric compared to the nodes in the LEFT axilla. No suspicious lymph nodes are identified sonographically. IMPRESSION: 1. Suspicious mass in the 10 o'clock location of the RIGHT breast. 2. No RIGHT axillary adenopathy. RECOMMENDATION: Ultrasound-guided core biopsy is recommended. This will be performed later today and dictated separately. I have discussed the  findings and recommendations with the patient. Results were also provided in writing at the conclusion of the visit. If applicable, a reminder letter will be sent to the patient regarding the next appointment. BI-RADS CATEGORY  5: Highly suggestive of malignancy. Electronically Signed   By: Nolon Nations M.D.   On: 06/24/2018 14:50   Mm Diag Breast Tomo Bilateral  Result Date: 06/24/2018 CLINICAL DATA:  RIGHT breast lump noted 3 weeks ago. EXAM: DIGITAL DIAGNOSTIC BILATERAL MAMMOGRAM WITH CAD AND TOMO ULTRASOUND RIGHT BREAST COMPARISON:  05/16/2015 ACR Breast Density Category b: There are scattered areas of fibroglandular density. FINDINGS: There is  an indistinct mass in the UPPER-OUTER QUADRANT of the RIGHT breast, marked as palpable with BB. LEFT breast is negative. Mammographic images were processed with CAD. On physical exam, I palpate a discrete mass in the 10 o'clock location of the RIGHT breast 7 centimeters from the nipple. Targeted ultrasound is performed, showing irregular hypoechoic mass with hyperechoic margins and significant internal vascularity in the 10 o'clock location 7 centimeters from the RIGHT nipple. There are mixed posterior acoustic features. Evaluation of the RIGHT axilla there is multiple mildly prominent lymph nodes which are symmetric compared to the nodes in the LEFT axilla. No suspicious lymph nodes are identified sonographically. IMPRESSION: 1. Suspicious mass in the 10 o'clock location of the RIGHT breast. 2. No RIGHT axillary adenopathy. RECOMMENDATION: Ultrasound-guided core biopsy is recommended. This will be performed later today and dictated separately. I have discussed the findings and recommendations with the patient. Results were also provided in writing at the conclusion of the visit. If applicable, a reminder letter will be sent to the patient regarding the next appointment. BI-RADS CATEGORY  5: Highly suggestive of malignancy. Electronically Signed   By: Nolon Nations M.D.   On: 06/24/2018 14:50   Mm Clip Placement Right  Result Date: 06/24/2018 CLINICAL DATA:  Post biopsy mammogram of the right breast for clip placement. EXAM: DIAGNOSTIC RIGHT MAMMOGRAM POST ULTRASOUND BIOPSY COMPARISON:  Previous exam(s). FINDINGS: Mammographic images were obtained following ultrasound guided biopsy of a right breast mass at 10 o'clock. The ribbon shaped biopsy marking clip is well positioned at the site of biopsy at 10 o'clock. IMPRESSION: Appropriate positioning of the ribbon shaped biopsy marking clip in the right breast mass at 10 o'clock. Final Assessment: Post Procedure Mammograms for Marker Placement Electronically  Signed   By: Ammie Ferrier M.D.   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 This was found to be concordant by Dr. Ammie Ferrier. Pathology results were discussed with the patient by telephone. The patient reported doing well after the biopsy with tenderness at the site. Post biopsy instructions and care were reviewed and questions were answered. The patient was encouraged to call [facility] for any additional concerns. The patient was referred to [facility] on July 02, 2018. Pathology results reported by Terie Purser, RN on 06/25/2018. Electronically Signed   By: Ammie Ferrier M.D.   On: 06/25/2018 12:47   Result Date: 06/25/2018 CLINICAL DATA:  42 year old female presenting for ultrasound-guided biopsy of a palpable right breast mass. EXAM: ULTRASOUND GUIDED RIGHT BREAST CORE NEEDLE BIOPSY COMPARISON:  Previous exam(s). FINDINGS: I met with the patient and  we discussed the procedure of ultrasound-guided biopsy, including benefits and alternatives. We discussed the high likelihood of a successful procedure. We discussed the risks of the procedure, including infection, bleeding, tissue injury, clip migration, and inadequate sampling. Informed written consent was given. The usual time-out protocol was performed immediately prior to the procedure. Lesion quadrant: Upper-outer quadrant Using sterile technique and 1% Lidocaine as local anesthetic, under direct ultrasound visualization, a 14 gauge spring-loaded device was used to perform biopsy of a mass in the right breast at 10 o'clock using an inferior approach. At the conclusion of the procedure a ribbon shaped tissue marker clip was deployed into  the biopsy cavity. Follow up 2 view mammogram was performed and dictated separately. IMPRESSION: Ultrasound guided biopsy of a mass in the right breast at 10 o'clock. No apparent complications. Electronically Signed: By: Ammie Ferrier M.D. On: 06/24/2018 16:21    ELIGIBLE FOR AVAILABLE RESEARCH PROTOCOL: UPBEAT  ASSESSMENT: 42 y.o. [place], [place] woman status post right breast upper outer quadrant biopsy 06/24/2018 for a clinical T2N0, stage Ib invasive ductal carcinoma, grade 3, estrogen and progesterone receptor negative, HER-2 amplified, with an MIB-1 of 80%.  (1) genetics testing pending  (2) goserelin to start prechemotherapy  (3) neoadjuvant chemotherapy to consist of carboplatin, docetaxel, trastuzumab and Pertuzumab given every 21 days x 6  (4) continue trastuzumab and Pertuzumab to complete 6 months  (5) definitive surgery to follow  (6) adjuvant radiation to follow surgery  PLAN: We spent more than 50% of today's hour-long appointment in counseling and coordination of care regarding the biology of the patient's diagnosis and the specifics of her situation. We first reviewed the fact that cancer is not one disease but more than 100 different diseases and that it is important to keep them separate-- otherwise when friends and relatives discuss their own cancer experiences with Tara Gomez confusion can result. Similarly we explained that if breast cancer spreads to the bone or liver, the patient would not have bone cancer or liver cancer, but breast cancer in the bone and breast cancer in the liver: one cancer in three places-- not 3 different cancers which otherwise would have to be treated in 3 different ways.  We discussed the difference between local and systemic therapy. In terms of loco-regional treatment, lumpectomy plus radiation is equivalent to mastectomy as far as survival is concerned. For this reason, and because the cosmetic results are generally superior, we recommend  breast conserving surgery.   We also noted that in terms of sequencing of treatments, whether systemic therapy or surgery is done first does not affect the ultimate outcome.  In her case specifically we recommend starting with chemoimmunotherapy which will give her time to get her genetics results and also give her prognostic information if she achieves a complete pathologic response which is our hope.  We then discussed the rationale for systemic therapy. There is some risk that this cancer may have already spread to other parts of her body.  In her case that risk is relatively high, likely in the 40-60% range.  We are going to obtain some staging scans at baseline but we expect these to be negative.  She understands that that does not mean she does not have disease outside the breast only that we cannot detected.  In short if she received only local therapy her risk of developing stage IV disease, which is incurable, would be high.   Next we went over the options for systemic therapy which are anti-estrogens, anti-HER-2  immunotherapy, and chemotherapy. Tara Gomez does not meet criteria for anti-estrogens. She is a good candidate for anti-HER-2 immunotherapy and chemotherapy and that is what we proposed  Specifically she should receive carboplatin and docetaxel together with trastuzumab and Pertuzumab by port every 3 weeks x 6.  We will continue the anti-HER-2 treatment for 6 months total.  After the chemotherapy she will proceed to definitive surgery and then radiation.  She has some interest in fertility preservation.  In addition she has significant problems with perimenstrual symptoms.  Accordingly we are going to start goserelin next week.  She will receive this monthly.  She knows that after the second dose she will stop having periods.  She knows that despite this there will be some risk of her becoming permanently menopausal from chemotherapy. After much discussion  Finally she does qualify for  genetics testing.  In patients who carry a deleterious mutation [for example in a  BRCA gene], the risk of a new breast cancer developing in the future may be sufficiently great that the patient may choose bilateral mastectomies. However if she wishes to keep her breasts in that situation it is safe to do so. That would require intensified screening, which generally means not only yearly mammography but a yearly breast MRI as well.   Tara Gomez has a good understanding of the overall plan. She agrees with it. She knows the goal of treatment in her case is cure. She will call with any problems that may develop before her next visit here.   Tara Gomez, Virgie Dad, MD  07/02/18 2:00 PM Medical Oncology and Hematology [facility] [address] [place], [place] [address] Tel. [phone]    Fax. [phone]  Alice Rieger, am acting as scribe for Chauncey Cruel MD.  I, Lurline Del MD, have reviewed the above documentation for accuracy and completeness, and I agree with the above.

## 2018-07-02 ENCOUNTER — Encounter: Payer: Self-pay | Admitting: Oncology

## 2018-07-02 ENCOUNTER — Ambulatory Visit: Payer: Self-pay | Admitting: Surgery

## 2018-07-02 ENCOUNTER — Other Ambulatory Visit: Payer: PRIVATE HEALTH INSURANCE

## 2018-07-02 ENCOUNTER — Other Ambulatory Visit: Payer: Self-pay

## 2018-07-02 ENCOUNTER — Inpatient Hospital Stay (HOSPITAL_BASED_OUTPATIENT_CLINIC_OR_DEPARTMENT_OTHER): Payer: Medicaid Other | Admitting: Oncology

## 2018-07-02 ENCOUNTER — Encounter: Payer: Self-pay | Admitting: Physical Therapy

## 2018-07-02 ENCOUNTER — Inpatient Hospital Stay: Payer: Medicaid Other | Attending: Oncology

## 2018-07-02 ENCOUNTER — Inpatient Hospital Stay: Payer: Medicaid Other

## 2018-07-02 ENCOUNTER — Ambulatory Visit: Payer: Medicaid Other | Attending: Surgery | Admitting: Physical Therapy

## 2018-07-02 ENCOUNTER — Ambulatory Visit
Admission: RE | Admit: 2018-07-02 | Discharge: 2018-07-02 | Disposition: A | Discharge status: Home / Self Care | Payer: PRIVATE HEALTH INSURANCE | Source: Ambulatory Visit | Attending: Radiation Oncology | Admitting: Radiation Oncology

## 2018-07-02 VITALS — BP 115/77 | HR 72 | Temp 98.5°F | Resp 18 | Ht 67.0 in | Wt 258.8 lb

## 2018-07-02 DIAGNOSIS — Z171 Estrogen receptor negative status [ER-]: Secondary | ICD-10-CM

## 2018-07-02 DIAGNOSIS — R937 Abnormal findings on diagnostic imaging of other parts of musculoskeletal system: Secondary | ICD-10-CM | POA: Insufficient documentation

## 2018-07-02 DIAGNOSIS — C50411 Malignant neoplasm of upper-outer quadrant of right female breast: Secondary | ICD-10-CM

## 2018-07-02 DIAGNOSIS — Z0000 Encounter for general adult medical examination without abnormal findings: Secondary | ICD-10-CM

## 2018-07-02 DIAGNOSIS — R293 Abnormal posture: Secondary | ICD-10-CM | POA: Diagnosis present

## 2018-07-02 DIAGNOSIS — Z5112 Encounter for antineoplastic immunotherapy: Secondary | ICD-10-CM | POA: Insufficient documentation

## 2018-07-02 DIAGNOSIS — R590 Localized enlarged lymph nodes: Secondary | ICD-10-CM | POA: Insufficient documentation

## 2018-07-02 DIAGNOSIS — Z6841 Body Mass Index (BMI) 40.0 and over, adult: Secondary | ICD-10-CM

## 2018-07-02 DIAGNOSIS — R928 Other abnormal and inconclusive findings on diagnostic imaging of breast: Secondary | ICD-10-CM | POA: Diagnosis not present

## 2018-07-02 DIAGNOSIS — F418 Other specified anxiety disorders: Secondary | ICD-10-CM | POA: Insufficient documentation

## 2018-07-02 DIAGNOSIS — E119 Type 2 diabetes mellitus without complications: Secondary | ICD-10-CM

## 2018-07-02 LAB — CBC WITH DIFFERENTIAL (CANCER CENTER ONLY)
Basophils Absolute: 0 10*3/uL (ref 0.0–0.1)
Basophils Relative: 0 %
Eosinophils Absolute: 0.2 10*3/uL (ref 0.0–0.5)
Eosinophils Relative: 3 %
HCT: 35.6 % (ref 34.8–46.6)
Hemoglobin: 11.3 g/dL — ABNORMAL LOW (ref 11.6–15.9)
Lymphocytes Relative: 36 %
Lymphs Abs: 2.2 10*3/uL (ref 0.9–3.3)
MCH: 25.8 pg (ref 25.1–34.0)
MCHC: 31.7 g/dL (ref 31.5–36.0)
MCV: 81.3 fL (ref 79.5–101.0)
Monocytes Absolute: 0.5 10*3/uL (ref 0.1–0.9)
Monocytes Relative: 9 %
Neutro Abs: 3.2 10*3/uL (ref 1.5–6.5)
Neutrophils Relative %: 52 %
Platelet Count: 312 10*3/uL (ref 145–400)
RBC: 4.38 MIL/uL (ref 3.70–5.45)
RDW: 14.4 % (ref 11.2–14.5)
WBC Count: 6.1 10*3/uL (ref 3.9–10.3)

## 2018-07-02 LAB — LIPID PANEL
Cholesterol: 154 mg/dL (ref 0–200)
HDL: 52 mg/dL (ref >40)
LDL Cholesterol: 68 mg/dL (ref 0–99)
Total CHOL/HDL Ratio: 3 RATIO
Triglycerides: 171 mg/dL — ABNORMAL HIGH (ref <150)
VLDL: 34 mg/dL (ref 0–40)

## 2018-07-02 LAB — CMP (CANCER CENTER ONLY)
ALT: 13 U/L (ref 0–44)
AST: 17 U/L (ref 15–41)
Albumin: 3.4 g/dL — ABNORMAL LOW (ref 3.5–5.0)
Alkaline Phosphatase: 88 U/L (ref 38–126)
Anion gap: 11 (ref 5–15)
BUN: 7 mg/dL (ref 6–20)
CO2: 25 mmol/L (ref 22–32)
Calcium: 8.7 mg/dL — ABNORMAL LOW (ref 8.9–10.3)
Chloride: 103 mmol/L (ref 98–111)
Creatinine: 0.85 mg/dL (ref 0.44–1.00)
GFR, Est AFR Am: >60 mL/min (ref >60)
GFR, Estimated: >60 mL/min (ref >60)
Glucose, Bld: 226 mg/dL — ABNORMAL HIGH (ref 70–99)
Potassium: 4.1 mmol/L (ref 3.5–5.1)
Sodium: 139 mmol/L (ref 135–145)
Total Bilirubin: 0.3 mg/dL (ref 0.3–1.2)
Total Protein: 7 g/dL (ref 6.5–8.1)

## 2018-07-02 LAB — HEMOGLOBIN A1C
Hgb A1c MFr Bld: 8.9 % — ABNORMAL HIGH (ref 4.8–5.6)
Mean Plasma Glucose: 208.73 mg/dL

## 2018-07-02 NOTE — Patient Instructions (Signed)

## 2018-07-02 NOTE — H&P (Signed)
Tara Gomez Documented: 07/02/2018 9:00 AM Location: [facility] Patient #: [id number] DOB: 01-Feb-1966 Undefined / Language: Cleophus Molt / Race: Black or African American Female  History of Present Illness Marcello Moores A. Jermya Dowding MD; 07/02/2018 4:29 PM) Patient words: Ptsent at the request of Dr Lisbeth Renshaw for right breast mass times 1 month. It is in the upper outer quadrant. It is sore sice he biopsy. No redness or discharge.                   CLINICAL DATA: RIGHT breast lump noted 3 weeks ago.  EXAM: DIGITAL DIAGNOSTIC BILATERAL MAMMOGRAM WITH CAD AND TOMO  ULTRASOUND RIGHT BREAST  COMPARISON: 05/16/2015  ACR Breast Density Category b: There are scattered areas of fibroglandular density.  FINDINGS: There is an indistinct mass in the UPPER-OUTER QUADRANT of the RIGHT breast, marked as palpable with BB. LEFT breast is negative.  Mammographic images were processed with CAD.  On physical exam, I palpate a discrete mass in the 10 o'clock location of the RIGHT breast 7 centimeters from the nipple.  Targeted ultrasound is performed, showing irregular hypoechoic mass with hyperechoic margins and significant internal vascularity in the 10 o'clock location 7 centimeters from the RIGHT nipple. There are mixed posterior acoustic features.  Evaluation of the RIGHT axilla there is multiple mildly prominent lymph nodes which are symmetric compared to the nodes in the LEFT axilla. No suspicious lymph nodes are identified sonographically.  IMPRESSION: 1. Suspicious mass in the 10 o'clock location of the RIGHT breast. 2. No RIGHT axillary adenopathy.  RECOMMENDATION: Ultrasound-guided core biopsy is recommended. This will be performed later today and dictated separately.  I have discussed the findings and recommendations with the patient. Results were also provided in writing at the conclusion of the visit. If applicable, a reminder letter will be sent to the  patient regarding the next appointment.  BI-RADS CATEGORY 5: Highly suggestive of malignancy.   Electronically Signed By: Nolon Nations M.D.        ADDITIONAL INFORMATION: FLUORESCENCE IN-SITU HYBRIDIZATION Results: GROUP 1: HER2 **POSITIVE** On the tissue sample received from this individual HER2 FISH was performed by a technologist and cell imaging and analysis on the [employer]. RATIO OF HER2/CEP17 SIGNALS 3.28 AVERAGE HER2 COPY NUMBER PER CELL 4.10 The ratio of Her-2: CEP 17 result exceeds the cutoff value of >=2.0 in breast tissue and a copy number of Her-2 signals exceeding the cutoff range of >=4.0 signals per cell in abnormal breast tissue. Arch Pathol Lab Med 1:1,2018 Thressa Sheller MD Pathologist, Electronic Signature ( Signed 06/26/2018) PROGNOSTIC INDICATORS Results: IMMUNOHISTOCHEMICAL AND MORPHOMETRIC ANALYSIS PERFORMED MANUALLY Estrogen Receptor: 0%, NEGATIVE Progesterone Receptor: 0%, NEGATIVE Proliferation Marker Ki67: 80% COMMENT: The negative hormone receptor study(ies) in this case has an internal positive control. 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 - SEE COMMENT. Microscopic Comment The carcinoma is grade III. A breast prognostic profile will be performed and the results reported separately. The results are called to [facility] on 06/25/18. (JBK:gt, 06/25/18) Enid Cutter MD Pathologist, Electronic Signature (Case signed 06/25/2018) Specimen Gross and Clinical Information Specimen Comment In formalin 4:10, extracted < 5 min; palpable right  breast mass Specimen(s) Obtained: Breast, right, needle core biopsy, mass, 10  o'clock Specimen Clinical Information Highly suspicious for malignancy Gross Received in formalin (TIF= 4:10pm, CIT less than five minutes) labeled with the patient's name and "right breast 10:00 mass" are four cores of tan-yellow soft tissue averaging 1.8 cm, entirely submitted in one cassette. (AK 06/24/2018) Stain(s) used in Diagnosis: The following stain(s) were used in diagnosing the case: Her2 FISH, ER-ACIS, PR-ACIS, KI-67-ACIS. The control(s) stained appropriately. Disclaimer Estrogen receptor (6F11), immunohistochemical stains are performed on formalin fixed, paraffin embedded tissue using a 3,3"-diaminobenzidine (DAB) chromogen and Leica Bond Autostainer System. The staining intensity of the nucleus is scored manually and is reported as the percentage of tumor cell nuclei demonstrating specific nuclear staining.Specimens are fixed in 10% Neutral Buffered Formalin for at least 6 hours and up to 72 hours. These tests have not be validated on decalcified tissue. Results should be interpreted with caution given the possibility of false negative 2 of 3 FINAL for CLIFFORD, COUDRIET ([id number]) Disclaimer(continued) results on decalcified specimens. HER2 IQFISH pharmDX (code [id number]) is a direct fluorescence in-situ hybridization assay designed to quantitatively determine HER2 gene amplification in formalin-fixed, paraffin-embedded tissue specimens. It is performed at [facility] and is reported using ASCO/CAP scoring criteria published in 2013. Ki-67 (MM1), immunohistochemical stains are performed on formalin fixed, paraffin embedded tissue using a 3,3"-diaminobenzidine (DAB) chromogen and Leica Bond Autostainer System. The staining intensity of the nucleus is scored manually and is reported as the percentage of tumor cell nuclei demonstrating specific nuclear staining.Specimens are fixed in 10% Neutral  Buffered Formalin for at least 6 hours and up to 72 hours. These tests have not be validated on decalcified tissue. Results should be interpreted with caution given the possibility of false negative results on decalcified specimens. PR progesterone receptor (16), immunohistochemical stains are performed.  The patient is a 42 year old female.   Past Surgical History Tawni Pummel, RN; 07/02/2018 9:00 AM) No pertinent past surgical history  Diagnostic Studies History Tawni Pummel, RN; 07/02/2018 9:00 AM) Colonoscopy never Mammogram within last year Pap Smear 1-5 years ago  Social History Tawni Pummel, RN; 07/02/2018 9:00 AM) No alcohol use No caffeine use No drug use Tobacco use Never smoker.  Family History Tawni Pummel, RN; 07/02/2018 9:00 AM) Arthritis Mother. Cerebrovascular Accident Mother. Diabetes Mellitus Mother. Hypertension Mother. Respiratory Condition Father. Seizure disorder Mother.  Other Problems Tawni Pummel, RN; 07/02/2018 9:00 AM) Diabetes Mellitus Heart murmur Other disease, cancer, significant illness     Review of Systems (Damariz Paganelli A. Britiney Blahnik MD; 07/02/2018 4:30 PM) General Present- Weight Gain. Not Present- Appetite Loss, Chills, Fatigue, Fever, Night Sweats and Weight Loss. Skin Not Present- Change in Wart/Mole, Dryness, Hives, Jaundice, New Lesions, Non-Healing Wounds, Rash and Ulcer. HEENT Present- Visual Disturbances. Not Present- Earache, Hearing Loss, Hoarseness, Nose Bleed, Oral Ulcers, Ringing in the Ears, Seasonal Allergies, Sinus Pain, Sore Throat, Wears glasses/contact lenses and Yellow Eyes. Respiratory Not Present- Bloody sputum, Chronic Cough, Difficulty Breathing, Snoring and Wheezing. Breast Present- Breast Mass. Not Present- Breast Pain, Nipple Discharge and Skin Changes. Cardiovascular Not Present- Chest Pain, Difficulty Breathing Lying Down, Leg Cramps, Palpitations, Rapid Heart Rate, Shortness of Breath and Swelling  of Extremities. Gastrointestinal Not Present- Abdominal Pain, Bloating, Bloody Stool, Change in Bowel Habits, Chronic diarrhea, Constipation, Difficulty Swallowing, Excessive gas, Gets full quickly at meals, Hemorrhoids, Indigestion, Nausea, Rectal Pain and Vomiting. Female Genitourinary Not Present- Frequency, Nocturia, Painful Urination, Pelvic Pain and Urgency. Musculoskeletal Not Present- Back Pain, Joint Pain, Joint Stiffness, Muscle Pain, Muscle Weakness and Swelling of Extremities. Neurological Not Present-  Decreased Memory, Fainting, Headaches, Numbness, Seizures, Tingling, Tremor, Trouble walking and Weakness. Psychiatric Present- Depression. Not Present- Anxiety, Bipolar, Change in Sleep Pattern, Fearful and Frequent crying. Endocrine Not Present- Cold Intolerance, Excessive Hunger, Hair Changes, Heat Intolerance, Hot flashes and New Diabetes. Hematology Not Present- Blood Thinners, Easy Bruising, Excessive bleeding, Gland problems, HIV and Persistent Infections. All other systems negative   Physical Exam (Ednah Hammock A. Lavonne Kinderman MD; 07/02/2018 4:30 PM)  General Mental Status-Alert. General Appearance-Consistent with stated age. Hydration-Well hydrated. Voice-Normal.  Head and Neck Head-normocephalic, atraumatic with no lesions or palpable masses. Trachea-midline. Thyroid Gland Characteristics - normal size and consistency.  Breast Note: 2 cm mass right breast UOQ mobile left breast normal  Cardiovascular Cardiovascular examination reveals -normal heart sounds, regular rate and rhythm with no murmurs and normal pedal pulses bilaterally.  Neurologic Neurologic evaluation reveals -alert and oriented x 3 with no impairment of recent or remote memory. Mental Status-Normal.  Musculoskeletal Normal Exam - Left-Upper Extremity Strength Normal and Lower Extremity Strength Normal. Normal Exam - Right-Upper Extremity Strength Normal and Lower Extremity Strength  Normal.  Lymphatic Head & Neck  General Head & Neck Lymphatics: Bilateral - Description - Normal. Axillary  General Axillary Region: Bilateral - Description - Normal. Tenderness - Non Tender.    Assessment & Plan (Isabellarose Kope A. Ellwyn Ergle MD; 07/02/2018 4:32 PM)  BREAST CANCER, RIGHT (C50.911) Impression: seen in [facility] recommend neoadjuvant chemotherapy and port placement breast conservation possible down the road Risk of lumpectomy include bleeding, infection, seroma, more surgery, use of seed/wire, wound care, cosmetic deformity and the need for other treatments, death , blood clots, death. Pt agrees to proceed. Pt requires port placement for chemotherapy. Risk include bleeding, infection, pneumothorax, hemothorax, mediastinal injury, nerve injury , blood vessel injury, strke, blood clots, death, migration. embolization and need for additional procedures. Pt agrees to proceed. Risk of sentinel lymph node mapping include bleeding, infection, lymphedema, shoulder pain. stiffness, dye allergy. cosmetic deformity , blood clots, death, need for more surgery. Pt agres to proceed.  Current Plans You are being scheduled for surgery- Our schedulers will call you.  You should hear from our office's scheduling department within 5 working days about the location, date, and time of surgery. We try to make accommodations for patient's preferences in scheduling surgery, but sometimes the OR schedule or the surgeon's schedule prevents [place] from making those accommodations.  If you have not heard from our office [phone]) in 5 working days, call the office and ask for your surgeon's nurse.  If you have other questions about your diagnosis, plan, or surgery, call the office and ask for your surgeon's nurse.  Pt Education - [facility] Breast Cancer Information Given - [facility] "Breast Journey" Package We discussed the staging and pathophysiology of breast cancer. We discussed all of the different options for treatment  for breast cancer including surgery, chemotherapy, radiation therapy, Herceptin, and antiestrogen therapy. We discussed a sentinel lymph node biopsy as she does not appear to having lymph node involvement right now. We discussed the performance of that with injection of radioactive tracer and blue dye. We discussed that she would have an incision underneath her axillary hairline. We discussed that there is a bout a 10-20% chance of having a positive node with a sentinel lymph node biopsy and we will await the permanent pathology to make any other first further decisions in terms of her treatment. One of these options might be to return to the operating room to perform an axillary lymph node dissection. We  discussed about a 1-2% risk lifetime of chronic shoulder pain as well as lymphedema associated with a sentinel lymph node biopsy. We discussed the options for treatment of the breast cancer which included lumpectomy versus a mastectomy. We discussed the performance of the lumpectomy with a wire placement. We discussed a 10-20% chance of a positive margin requiring reexcision in the operating room. We also discussed that she may need radiation therapy or antiestrogen therapy or both if she undergoes lumpectomy. We discussed the mastectomy and the postoperative care for that as well. We discussed that there is no difference in her survival whether she undergoes lumpectomy with radiation therapy or antiestrogen therapy versus a mastectomy. There is a slight difference in the local recurrence rate being 3-5% with lumpectomy and about 1% with a mastectomy. We discussed the risks of operation including bleeding, infection, possible reoperation. She understands her further therapy will be based on what her stages at the time of her operation.  Use of a central venous catheter for intravenous therapy was discussed. Technique of catheter placement using ultrasound and fluoroscopy guidance was discussed. Risks such  as bleeding, infection, pneumothorax, catheter occlusion, reoperation, and other risks were discussed. I noted a good likelihood this will help address the problem. Questions were answered. The patient expressed understanding & wishes to proceed.  BREAST CANCER, RIGHT (C50.911)

## 2018-07-02 NOTE — Progress Notes (Signed)
Radiation Oncology         [phone] ________________________________  Name: Tara Gomez        MRN: [id number]  Date of Service: 07/02/2018 DOB: 1975-12-01  QI:[id number], Elzie Rings, MD  Erroll Luna, MD      REFERRING PHYSICIAN: Erroll Luna, MD   DIAGNOSIS: The encounter diagnosis was Malignant neoplasm of upper-outer quadrant of right breast in female, estrogen receptor negative ([place]).   HISTORY OF PRESENT ILLNESS: Tara Gomez is a 42 y.o. female seen in the multidisciplinary breast clinic for a new diagnosis of right breast cancer. The patient was noted to have a palpable mass in the right breast for about 3 weeks. She had diagnostic imaging of the breast that revealed a 2.4 x 2.2 x 2.2 cm mass at 10:00, and her axilla was negative. She underwent a biopsy on 06/24/18 that revealed a grade 3 invasive ductal carcinoma, ER/PR negative, HER2 amplifeid, and her Ki 67 was 80%. She comes today to discuss options of treatment for her cancer.   PREVIOUS RADIATION THERAPY: No   PAST MEDICAL HISTORY:  Past Medical History:  Diagnosis Date  . Diabetes mellitus without complication ([place])        PAST SURGICAL HISTORY: Past Surgical History:  Procedure Laterality Date  . INDUCED ABORTION       FAMILY HISTORY:  Family History  Problem Relation Age of Onset  . Diabetes Mother   . Hypertension Mother   . Diabetes Paternal Grandmother   . Breast cancer Neg Hx      SOCIAL HISTORY:  reports that she has never smoked. She has never used smokeless tobacco. She reports that she has current or past drug history. She reports that she does not drink alcohol. The patient is single and lives in [place]. She works for [employer]: Patient has no known allergies.   MEDICATIONS:  Current Outpatient Medications  Medication Sig Dispense Refill  . metFORMIN (GLUCOPHAGE) 500 MG tablet Take 1 tablet (500 mg total) by mouth 2 (two) times daily with a meal. (Patient not  taking: Reported on 06/24/2018) 60 tablet 0   No current facility-administered medications for this encounter.      REVIEW OF SYSTEMS: On review of systems, the patient reports that she is doing well overall. She denies any chest pain, shortness of breath, cough, fevers, chills, night sweats, unintended weight changes. She denies any bowel or bladder disturbances, and denies abdominal pain, nausea or vomiting. She denies any new musculoskeletal or joint aches or pains. A complete review of systems is obtained and is otherwise negative.     PHYSICAL EXAM:  Wt Readings from Last 3 Encounters:  05/12/15 248 lb 4.8 oz (112.6 kg)   Temp Readings from Last 3 Encounters:  04/21/15 97.5 F (36.4 C) (Oral)   BP Readings from Last 3 Encounters:  06/24/18 114/76  04/21/15 130/82   Pulse Readings from Last 3 Encounters:  04/21/15 90     In general this is a well appearing African American female in no acute distress. She is alert and oriented x4 and appropriate throughout the examination. HEENT reveals that the patient is normocephalic, atraumatic. EOMs are intact.  Skin is intact without any evidence of gross lesions. Cardiovascular exam reveals a regular rate and rhythm, no clicks rubs or murmurs are auscultated. Chest is clear to auscultation bilaterally. Lymphatic assessment is performed and does not reveal any adenopathy in the cervical, supraclavicular, axillary, or inguinal chains. Bilateral breast exam is performed and reveals  fullness along the lateral breast about 10:00 in the upper outer quadrant of the right breast and post biopsy ecchymosis. There is no mass of the left breast, neither reveals nipple bleeding or discharge. Abdomen has active bowel sounds in all quadrants and is intact. The abdomen is soft, non tender, non distended. Lower extremities are negative for pretibial pitting edema, deep calf tenderness, cyanosis or clubbing.   ECOG = 1  0 - Asymptomatic (Fully active, able  to carry on all predisease activities without restriction)  1 - Symptomatic but completely ambulatory (Restricted in physically strenuous activity but ambulatory and able to carry out work of a light or sedentary nature. For example, light housework, office work)  2 - Symptomatic, <50% in bed during the day (Ambulatory and capable of all self care but unable to carry out any work activities. Up and about more than 50% of waking hours)  3 - Symptomatic, >50% in bed, but not bedbound (Capable of only limited self-care, confined to bed or chair 50% or more of waking hours)  4 - Bedbound (Completely disabled. Cannot carry on any self-care. Totally confined to bed or chair)  5 - Death   Eustace Pen MM, Creech RH, Tormey DC, et al. [phone]). "Toxicity and response criteria of the [facility]". [place] Oncol. 5 (6): 649-55    LABORATORY DATA:  Lab Results  Component Value Date   WBC 5.3 04/21/2015   HGB 11.6 (L) 04/21/2015   HCT 36.8 04/21/2015   MCV 78.5 04/21/2015   PLT 361 04/21/2015   Lab Results  Component Value Date   NA 137 04/21/2015   K 4.0 04/21/2015   CL 100 (L) 04/21/2015   CO2 24 04/21/2015   Lab Results  Component Value Date   ALT 19 04/21/2015   AST 23 04/21/2015   ALKPHOS 102 04/21/2015   BILITOT 0.2 (L) 04/21/2015      RADIOGRAPHY: US Breast Ltd Uni Right Inc Axilla  Result Date: 06/24/2018 CLINICAL DATA:  RIGHT breast lump noted 3 weeks ago. EXAM: DIGITAL DIAGNOSTIC BILATERAL MAMMOGRAM WITH CAD AND TOMO ULTRASOUND RIGHT BREAST COMPARISON:  05/16/2015 ACR Breast Density Category b: There are scattered areas of fibroglandular density. FINDINGS: There is an indistinct mass in the UPPER-OUTER QUADRANT of the RIGHT breast, marked as palpable with BB. LEFT breast is negative. Mammographic images were processed with CAD. On physical exam, I palpate a discrete mass in the 10 o'clock location of the RIGHT breast 7 centimeters from the nipple. Targeted  ultrasound is performed, showing irregular hypoechoic mass with hyperechoic margins and significant internal vascularity in the 10 o'clock location 7 centimeters from the RIGHT nipple. There are mixed posterior acoustic features. Evaluation of the RIGHT axilla there is multiple mildly prominent lymph nodes which are symmetric compared to the nodes in the LEFT axilla. No suspicious lymph nodes are identified sonographically. IMPRESSION: 1. Suspicious mass in the 10 o'clock location of the RIGHT breast. 2. No RIGHT axillary adenopathy. RECOMMENDATION: Ultrasound-guided core biopsy is recommended. This will be performed later today and dictated separately. I have discussed the findings and recommendations with the patient. Results were also provided in writing at the conclusion of the visit. If applicable, a reminder letter will be sent to the patient regarding the next appointment. BI-RADS CATEGORY  5: Highly suggestive of malignancy. Electronically Signed   By: Nolon Nations M.D.   On: 06/24/2018 14:50   Mm Diag Breast Tomo Bilateral  Result Date: 06/24/2018 CLINICAL DATA:  RIGHT  breast lump noted 3 weeks ago. EXAM: DIGITAL DIAGNOSTIC BILATERAL MAMMOGRAM WITH CAD AND TOMO ULTRASOUND RIGHT BREAST COMPARISON:  05/16/2015 ACR Breast Density Category b: There are scattered areas of fibroglandular density. FINDINGS: There is an indistinct mass in the UPPER-OUTER QUADRANT of the RIGHT breast, marked as palpable with BB. LEFT breast is negative. Mammographic images were processed with CAD. On physical exam, I palpate a discrete mass in the 10 o'clock location of the RIGHT breast 7 centimeters from the nipple. Targeted ultrasound is performed, showing irregular hypoechoic mass with hyperechoic margins and significant internal vascularity in the 10 o'clock location 7 centimeters from the RIGHT nipple. There are mixed posterior acoustic features. Evaluation of the RIGHT axilla there is multiple mildly prominent lymph  nodes which are symmetric compared to the nodes in the LEFT axilla. No suspicious lymph nodes are identified sonographically. IMPRESSION: 1. Suspicious mass in the 10 o'clock location of the RIGHT breast. 2. No RIGHT axillary adenopathy. RECOMMENDATION: Ultrasound-guided core biopsy is recommended. This will be performed later today and dictated separately. I have discussed the findings and recommendations with the patient. Results were also provided in writing at the conclusion of the visit. If applicable, a reminder letter will be sent to the patient regarding the next appointment. BI-RADS CATEGORY  5: Highly suggestive of malignancy. Electronically Signed   By: Nolon Nations M.D.   On: 06/24/2018 14:50   Mm Clip Placement Right  Result Date: 06/24/2018 CLINICAL DATA:  Post biopsy mammogram of the right breast for clip placement. EXAM: DIAGNOSTIC RIGHT MAMMOGRAM POST ULTRASOUND BIOPSY COMPARISON:  Previous exam(s). FINDINGS: Mammographic images were obtained following ultrasound guided biopsy of a right breast mass at 10 o'clock. The ribbon shaped biopsy marking clip is well positioned at the site of biopsy at 10 o'clock. IMPRESSION: Appropriate positioning of the ribbon shaped biopsy marking clip in the right breast mass at 10 o'clock. Final Assessment: Post Procedure Mammograms for Marker Placement Electronically Signed   By: Ammie Ferrier M.D.   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 This was found to be concordant by Dr. Ammie Ferrier. Pathology results were discussed with the patient by telephone. The patient reported doing well after the biopsy with tenderness at the site. Post biopsy instructions and care were reviewed and questions were answered. The patient was encouraged to call [facility] for any additional concerns. The patient was referred to [facility] on July 02, 2018. Pathology results reported by Terie Purser, RN on 06/25/2018. Electronically Signed   By: Ammie Ferrier M.D.   On: 06/25/2018 12:47   Result Date: 06/25/2018 CLINICAL DATA:  42 year old female presenting for ultrasound-guided biopsy of a palpable right breast mass. EXAM: ULTRASOUND GUIDED RIGHT BREAST CORE NEEDLE BIOPSY COMPARISON:  Previous exam(s). FINDINGS: I met with the patient and we discussed the procedure of ultrasound-guided biopsy, including benefits and alternatives. We discussed the high likelihood of a successful procedure. We discussed the risks of the procedure, including infection, bleeding, tissue injury, clip migration, and inadequate sampling. Informed written consent was given. The usual time-out protocol was performed immediately prior to the procedure. Lesion quadrant: Upper-outer quadrant Using sterile technique and 1% Lidocaine as local anesthetic, under direct ultrasound visualization,  a 14 gauge spring-loaded device was used to perform biopsy of a mass in the right breast at 10 o'clock using an inferior approach. At the conclusion of the procedure a ribbon shaped tissue marker clip was deployed into the biopsy cavity. Follow up 2 view mammogram was performed and dictated separately. IMPRESSION: Ultrasound guided biopsy of a mass in the right breast at 10 o'clock. No apparent complications. Electronically Signed: By: Ammie Ferrier M.D. On: 06/24/2018 16:21       IMPRESSION/PLAN: 1. Stage IIA, cT2N0M0 grade 3 HER2 amplified, invasive ductal carcinoma of the right breast. Dr. Lisbeth Renshaw discusses the pathology findings and reviews the nature of invasive, HER2 positive breast disease. The consensus from the breast conference includes proceeding with MRI and staging imaging followed by  neoadjuvant chemotherapy. Following chemotherapy, she would likely be a candidate for breast conservation with lumpectomy and sentinel node biopsy. Her course would then be followed by external radiotherapy to the breast. We discussed the risks, benefits, short, and long term effects of radiotherapy, and the patient is interested in proceeding. Dr. Lisbeth Renshaw discusses the delivery and logistics of radiotherapy and anticipates a course of 6 1/2 weeks of radiotherapy. We will see her back about 2 weeks after surgery to discuss the simulation process and anticipate we starting radiotherapy about 4-6 weeks after surgery.  2. Possible genetic predisposition to malignancy. The patient is a candidate for genetic testing given her personal history. She was offered referral and is interested in this process.   The above documentation reflects my direct findings during this shared patient visit. Please see the separate note by Dr. Lisbeth Renshaw on this date for the remainder of the patient's plan of care.    Carola Rhine, PAC

## 2018-07-02 NOTE — Progress Notes (Signed)
Nutrition Assessment  Reason for Assessment:  Pt seen in Breast Clinic  ASSESSMENT:   42 year old female with new diagnosis of breast cancer.  Past medical history of DM.  Noted patient has attended classes at [facility] for DM management  Medications:  reviewed  Labs: reviewed  Anthropometrics:   Height: 67 inches Weight: 258 lb BMI: 40   NUTRITION DIAGNOSIS: Food and nutrition related knowledge deficit related to new diagnosis of breast cancer as evidenced by no prior need for nutrition related information.  INTERVENTION:   Discussed and provided packet of information regarding nutritional tips for breast cancer patients.  Questions answered.  Teachback method used.  Contact information provided and patient knows to contact me with questions/concerns.    MONITORING, EVALUATION, and GOAL: Pt will consume a healthy plant based diet to maintain lean body mass throughout treatment.   Tara Gomez B. Zenia Resides, [place], [place] Registered Dietitian [phone] (pager)

## 2018-07-02 NOTE — Therapy (Signed)
[place], [place], [address] Phone: [phone]   Fax:  [phone]  Physical Therapy Evaluation  Patient Details  Name: Tara Gomez MRN: [id number] Date of Birth: January 22, 1976 Referring Provider: Dr. Erroll Luna   Encounter Date: 07/02/2018  PT End of Session - 07/02/18 1650    Visit Number  1    Number of Visits  1    PT Start Time  1602    PT Stop Time  1632    PT Time Calculation (min)  30 min    Activity Tolerance  Patient tolerated treatment well    Behavior During Therapy  [facility] for tasks assessed/performed       Past Medical History:  Diagnosis Date  . Depression   . Diabetes mellitus without complication [facility])     Past Surgical History:  Procedure Laterality Date  . INDUCED ABORTION      There were no vitals filed for this visit.   Subjective Assessment - 07/02/18 1638    Subjective  Patient reports she is here today to be seen by her medical team for her newly diagnosed right breat cancer. She reports that she is having a very hard time and is feeling very depressed and hopeless due to her situation.    Pertinent History  Patient was diagnosed on 06/24/18 with right grade III invasive ductal carcinoma breast cancer. It measures 2.4 cm and is located in the upper outer quadrant. It is ER/PR negative and HER2 positive with a Ki67 of 80%. She reports being very depressed and was diagnosed 8 years ago with bipolar disorder per her report but is unmedicated.     Patient Stated Goals  Reduce lymphedema risk and learn post op shoulder ROM HEP    Currently in Pain?  No/denies         [facility] PT Assessment - 07/02/18 0001      Assessment   Medical Diagnosis  Right breast cancer    Referring Provider  Dr. Marcello Moores Cornett    Onset Date/Surgical Date  06/24/18    Hand Dominance  Right    Prior Therapy  none      Precautions   Precautions  Other (comment)    Precaution Comments  active  cancer      Restrictions   Weight Bearing Restrictions  No      Balance Screen   Has the patient fallen in the past 6 months  No    Has the patient had a decrease in activity level because of a fear of falling?   No    Is the patient reluctant to leave their home because of a fear of falling?   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    Leisure  She does not exercise      Cognition   Overall Cognitive Status  Within Functional Limits for tasks assessed    Behaviors  Other (comment) Reports depression, hopelessness; poor eye contact      Posture/Postural Control   Posture/Postural Control  Postural limitations    Postural  Limitations  Rounded Shoulders;Forward head      ROM / Strength   AROM / PROM / Strength  AROM;Strength      AROM   AROM Assessment Site  Shoulder;Cervical    Right/Left Shoulder  Right;Left    Right Shoulder Extension  45 Degrees    Right Shoulder Flexion  155 Degrees    Right Shoulder ABduction  156 Degrees    Right Shoulder Internal Rotation  52 Degrees    Right Shoulder External Rotation  78 Degrees    Left Shoulder Extension  48 Degrees    Left Shoulder Flexion  149 Degrees    Left Shoulder ABduction  150 Degrees    Left Shoulder Internal Rotation  65 Degrees    Left Shoulder External Rotation  79 Degrees    Cervical Flexion  WNL    Cervical Extension  WNL    Cervical - Right Side Bend  WNL    Cervical - Left Side Bend  WNL    Cervical - Right Rotation  WNL    Cervical - Left Rotation  WNL      Strength   Overall Strength  Within functional limits for tasks performed        LYMPHEDEMA/ONCOLOGY QUESTIONNAIRE - 07/02/18  1649      Type   Cancer Type  Right breast cancer      Lymphedema Assessments   Lymphedema Assessments  Upper extremities      Right Upper Extremity Lymphedema   10 cm Proximal to Olecranon Process  33.4 cm    Olecranon Process  27.8 cm    10 cm Proximal to Ulnar Styloid Process  23.3 cm    Just Proximal to Ulnar Styloid Process  17.1 cm    Across Hand at [employer]  21.7 cm    At [place] of 2nd Digit  6.5 cm      Left Upper Extremity Lymphedema   10 cm Proximal to Olecranon Process  34.2 cm    Olecranon Process  28.2 cm    10 cm Proximal to Ulnar Styloid Process  22.3 cm    Just Proximal to Ulnar Styloid Process  17.7 cm    Across Hand at [employer]  20.8 cm    At [place] of 2nd Digit  6.5 cm             Objective measurements completed on examination: See above findings.     Patient was instructed today in a home exercise program today for post op shoulder range of motion. These included active assist shoulder flexion in sitting, scapular retraction, wall walking with shoulder abduction, and hands behind head external rotation.  She was encouraged to do these twice a day, holding 3 seconds and repeating 5 times when permitted by her physician.    PT Education - 07/02/18 1650    Education Details  Lymphedema risk reduction and post op shoulder ROM HEP    Person(s) Educated  Patient    Methods  Explanation;Demonstration;Handout    Comprehension  Returned demonstration;Verbalized understanding      Screening for Suicide - performed by PT due to pt reporting feelings of severe depression and hopelessness Answer the following questions with Yes or No and place an "x" beside the action taken.  1. Over the past two weeks, have you felt down, depressed, or hopeless?   Y  2. Within the past two weeks, have you felt little interest or pleasure in life?  Darreld Mclean  If YES to either #1 or #2, then ask #3  3. Have you had thoughts that that life is not worth living or that you  might be       better off dead?   NO  If answer is NO and suspicion is low, then end   4. Over this past week, have you had any thoughts about hurting or even killing yourself?  NO  If NO, then end. Patient in no immediate danger   5. If so, do you believe that you intend to or will harm yourself?       If NO, then end. Patient in no immediate danger   6.  Do you have a plan as to how you would hurt yourself?     7.  Over this past week, have you actually done anything to hurt yourself?    IF YES answers to either #4, #5, #6 or #7, then patient is AT RISK for suicide   Actions Taken  _X___  Screening negative; no further action required  ____  Screening positive; no immediate danger and patient already in treatment with a  mental health provider. Advise patient to speak to their mental health provider.  ____  Screening positive; no immediate danger. Patient advised to contact a mental  health provider for further assessment.   ____  Screening positive; in immediate danger as patient states intention of killing self,  has plan and a sense of imminence. Do not leave alone. Seek permission from  patient to contact a family member to inform them. Direct patient to go to ED.       Breast Clinic Goals - 07/02/18 1655      Patient will be able to verbalize understanding of pertinent lymphedema risk reduction practices relevant to her diagnosis specifically related to skin care.   Time  1    Period  Days    Status  Achieved      Patient will be able to return demonstrate and/or verbalize understanding of the post-op home exercise program related to regaining shoulder range of motion.   Time  1    Period  Days    Status  Achieved      Patient will be able to verbalize understanding of the importance of attending the postoperative After Breast Cancer Class for further lymphedema risk reduction education and therapeutic exercise.   Time  1    Period  Days    Status   Achieved            Plan - 07/02/18 1651    Clinical Impression Statement  Patient was diagnosed on 06/24/18 with right grade III invasive ductal carcinoma breast cancer. It measures 2.4 cm and is located in the upper outer quadrant. It is ER/PR negative and HER2 positive with a Ki67 of 80%. She reports being very depressed and was diagnosed 8 years ago with bipolar disorder per her report but is unmedicated.  Her multidisciplinary medical team met prior to her asessments to determine a recommended treatment plan. She is planning to have neoadjuvant chemotherapy followed by a right lumpectomy and sentinel node biopsy and radiation. She will benefit from a post op PT visit to reassess and determine needs.    History and Personal Factors relevant to plan of care:  Depression; lives alone    Clinical Presentation  Evolving    Clinical Presentation due to:  Will have staging scans to determine extent of disease    Clinical Decision Making  Moderate    Rehab Potential  Excellent    Clinical Impairments Affecting Rehab Potential  Depression    PT Frequency  One time visit    PT Treatment/Interventions  ADLs/Self Care Home Management;Therapeutic exercise;Patient/family education    PT Next Visit Plan  Will reassess post op if MD refers    PT Home Exercise Plan  Post op shoulder ROM HEP    Consulted and Agree with Plan of Care  Patient       Patient will benefit from skilled therapeutic intervention in order to improve the following deficits and impairments:  Pain, Impaired UE functional use, Decreased range of motion, Decreased knowledge of precautions  Visit Diagnosis: Malignant neoplasm of upper-outer quadrant of right breast in female, estrogen receptor negative ([place]) - Plan: PT plan of care cert/re-cert  Abnormal posture - Plan: PT plan of care cert/re-cert   Patient will follow up at outpatient cancer rehab 3-4 weeks following surgery.  If the patient requires physical therapy at that  time, a specific plan will be dictated and sent to the referring physician for approval. The patient was educated today on appropriate basic range of motion exercises to begin post operatively and the importance of attending the After Breast Cancer class following surgery.  Patient was educated today on lymphedema risk reduction practices as it pertains to recommendations that will benefit the patient immediately following surgery.  She verbalized good understanding.    Problem List Patient Active Problem List   Diagnosis Date Noted  . Morbid obesity with BMI of 40.0-44.9, adult ([place]) 07/02/2018  . Diabetes mellitus without complication ([place]) 07/57/3225  . Malignant neoplasm of upper-outer quadrant of right breast in female, estrogen receptor negative ([place]) 06/27/2018    Annia Friendly, PT 07/02/18 4:58 PM  [place], [place], [address] Phone: [phone]   Fax:  [phone]  Name: Tara Gomez MRN: [id number] Date of Birth: [birth year]

## 2018-07-02 NOTE — Progress Notes (Signed)
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 

## 2018-07-03 ENCOUNTER — Encounter: Payer: Self-pay | Admitting: General Practice

## 2018-07-03 NOTE — Progress Notes (Signed)
[place] Psychosocial Distress Screening Spiritual Care  Met with Tara Gomez in [facility] to introduce [facility] team/resources, reviewing distress screen per protocol.  The patient scored a 10 on the Psychosocial Distress Thermometer which indicates severe distress. Also assessed for distress and other psychosocial needs.   ONCBCN DISTRESS SCREENING 07/03/2018  Screening Type Initial Screening  Distress experienced in past week (1-10) 10  Practical problem type Work/school  Family Problem type Partner;Other (comment)  Emotional problem type Depression  Spiritual/Religous concerns type Facing my mortality;Loss of sense of purpose  Information Concerns Type Lack of info about diagnosis  Referral to support programs Yes   Tara Gomez had a flat affect and made very little eye contact during encounter (ca 60 min). Per pt, she has been feeling very depressed, having trouble getting off the couch and "wondering constantly, 'Am I soon going to die?' She notes that she was diagnosed with bipolar disorder in ca 2008, but feels a strong wariness about taking medication because "I studied chemistry; I know how these things [meds] affect the body--they can cause cancer," also musing aloud, "...but now I have cancer, so maybe I don't need to worry about that!" Tara Gomez states that she has no desire to harm or kill herself (see also SI screen in [place]/PT's note): "I want to live! I just have a feeling inside that I may not have much time left to live because of this cancer." She is open to referrals for counseling and psych to receive support for depression and explore tools for coping.  Tara Gomez has also experienced significant grief and loss this year. Per pt, her partner [name] died of kidney failure, a loss which was very sudden for her because she did not know how sick he was--which in turn contributes to heavy feelings of guilt. Tara Gomez's Doctor, general practice, beloved companion for a decade,  also died unexpectedly.   Per pt, she qualifies for Medicaid and has applied. 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  Tara Gomez brightened at discussion of [place] programming because she recognizes a need for "making meaning" [her words/insight] and means to interrupt/distract from intrusive thoughts. Per pt, she has been wanting to get back to working out because she knows that she feels better when she does and she has been wishing for a "creative outlet."  Normalized feelings of distress at diagnosis as well as seeking professional support for depression and intrusive thoughts. Encouraged use of multiple tools to cope with distress and depression, to make meaning, to experience enjoyment and a sense of accomplishment.   Follow up needed: Yes.  Will consult with LCSWs to make counseling and psych referrals, which pt welcomes. She also welcomes f/u calls and infusion visits from chaplain, so we will keep in touch for emotional support from the [facility] side.    [place], [place], [facility] Pager [phone] Voicemail [phone]

## 2018-07-04 ENCOUNTER — Other Ambulatory Visit: Payer: Self-pay | Admitting: *Deleted

## 2018-07-04 DIAGNOSIS — C50411 Malignant neoplasm of upper-outer quadrant of right female breast: Secondary | ICD-10-CM

## 2018-07-04 DIAGNOSIS — Z171 Estrogen receptor negative status [ER-]: Principal | ICD-10-CM

## 2018-07-07 ENCOUNTER — Encounter: Payer: Self-pay | Admitting: Oncology

## 2018-07-07 ENCOUNTER — Encounter: Payer: Self-pay | Admitting: General Practice

## 2018-07-07 NOTE — Progress Notes (Signed)
[place] Note  Tara Gomez by phone to f/u as planned. Her vocal affect appeared more engaged during this encounter. Per pt, her social stress includes a recent breakup. Per pt, she has been researching complementary natural remedies, including curcumin, to pursue alongside her [facility] care plan.  Per pt, she woke up with sharp, deep pain in her left back, which is limiting range of motion, general movement, and breathing. Encouraged her to share this information with her doctor and to phone [facility], providing phone number.  Consulted with LCSWs re Tara Gomez's request for [place] psych and counseling referrals. Suggested Dr Darleene Cleaver at [facility] and [facility] counseling intern Doris Cheadle as starting points, providing contact information.  Tara Gomez requests ongoing prayers and knows she can contact [place] and Support Team whenever needed/desired. Plan to f/u in person when she is infusion (schedule TBD). She verbalized appreciation.    [place], [place], [facility] Pager [phone] Voicemail [phone]

## 2018-07-07 NOTE — Progress Notes (Signed)
Patient called to discuss financial assistance(i.e.-[employer]). Discussed copay assistance and proof of income would be needed to apply for the [employer]. Advised patient her advocate is Lenise and she will reach out to her to discuss all possible assistance for her treatment plan. Patient has chemo ed on 8/16. Gave her Lenise contact name and number to leave a message to make appointment. She verbalized understanding.

## 2018-07-08 ENCOUNTER — Encounter: Payer: Self-pay | Admitting: *Deleted

## 2018-07-08 ENCOUNTER — Telehealth: Payer: Self-pay | Admitting: *Deleted

## 2018-07-08 NOTE — Telephone Encounter (Signed)
Spoke with patient to give her appointment date and time for IR to place her port (8/15 at 7am).  Instructions given.  Also reminded her of the upcoming appointments she has.

## 2018-07-09 ENCOUNTER — Ambulatory Visit (HOSPITAL_COMMUNITY)
Admission: RE | Admit: 2018-07-09 | Discharge: 2018-07-09 | Disposition: A | Discharge status: Home / Self Care | Payer: PRIVATE HEALTH INSURANCE | Source: Ambulatory Visit | Attending: Oncology | Admitting: Oncology

## 2018-07-09 ENCOUNTER — Other Ambulatory Visit: Payer: Self-pay | Admitting: Oncology

## 2018-07-09 ENCOUNTER — Other Ambulatory Visit: Payer: Self-pay | Admitting: Radiology

## 2018-07-09 ENCOUNTER — Encounter (HOSPITAL_COMMUNITY): Payer: Self-pay

## 2018-07-09 ENCOUNTER — Encounter: Payer: Self-pay | Admitting: Oncology

## 2018-07-09 ENCOUNTER — Other Ambulatory Visit: Payer: Self-pay

## 2018-07-09 DIAGNOSIS — Z171 Estrogen receptor negative status [ER-]: Principal | ICD-10-CM

## 2018-07-09 DIAGNOSIS — C50411 Malignant neoplasm of upper-outer quadrant of right female breast: Secondary | ICD-10-CM

## 2018-07-09 MED ORDER — GADOBENATE DIMEGLUMINE 529 MG/ML IV SOLN: 20.0000 mL | Freq: Once | INTRAVENOUS | Status: DC | PRN

## 2018-07-09 MED ORDER — DIAZEPAM 5 MG PO TABS: 5.0000 mg | ORAL_TABLET | Freq: Four times a day (QID) | ORAL | 0 refills | Status: AC | PRN

## 2018-07-09 NOTE — Progress Notes (Signed)
Attempted patient for Breast MRI, pt was in too much pain and [place] to tolerate MRI   Pt needs pain meds and anti anxiety meds prior to MRI. Pt requesting [facility] [employer],.  Education was given to patient about necessity of scan.

## 2018-07-09 NOTE — Progress Notes (Signed)
Called pt to introduce myself as her Arboriculturist.  Pt would like to apply for financial assistance.  Pt stated she was approved for Medicaid but haven't received her card yet.  She may have it for her next visit so drug replacement will not be needed and copay assistance will not be needed because Medicaid will pay for treatment at 100%.  We discussed the [employer].  Pt stated she's not working right now so I informed her a letter of support is needed.  She will provide one on 07/11/18.  Once received I will approve her for the grant, go over what it covers and give her an expense sheet.

## 2018-07-09 NOTE — Progress Notes (Signed)
Pt came from [place] and stated that she had to cancel her test due to severe back pain and unable to tolerate laying down for her breast MRI. Discussed with Dr.Magrinat and obtained order to have mri breast performed at [facility]. Sent script for valium to be picked up at [place] and to be taken before MRI. Pt to also take OTC aleve and Tylenol prior to scan for her back pain. Pt verbalized understanding.

## 2018-07-10 ENCOUNTER — Telehealth: Payer: Self-pay | Admitting: Oncology

## 2018-07-10 ENCOUNTER — Ambulatory Visit (HOSPITAL_COMMUNITY): Admission: RE | Admit: 2018-07-10 | Payer: PRIVATE HEALTH INSURANCE | Source: Ambulatory Visit

## 2018-07-10 NOTE — Progress Notes (Signed)
[place]  Telephone:(336) [phone] Fax:(336) [phone]     ID: Asma Boldon DOB: 1976/10/28  MR#: [id number]  ELF#:[id number]  Patient Care Team: Marylynn Pearson, MD as PCP - General (Obstetrics and Gynecology) Erroll Luna, MD as Consulting Physician (General Surgery) Glennie Rodda, Virgie Dad, MD as Consulting Physician (Oncology) Kyung Rudd, MD as Consulting Physician (Radiation Oncology) OTHER MD:  CHIEF COMPLAINT: Estrogen receptor negative breast cancer  CURRENT TREATMENT: Neoadjuvant chemotherapy; goserelin   HISTORY OF CURRENT ILLNESS: From the original intake note:  Posey palpated a mass in her right breast in early July 2019. She brought it to medical attention and underwent bilateral diagnostic mammography with tomography and right breast ultrasonography at [facility] on 06/24/2018 showing: breast density category B. There is a hypoechoic mass at the 10 o' clock right breast upper outer quadrant located 7 cm from the nipple. There were no suspicious lymph nodes in the right axilla.   Accordingly on 06/24/2018 she proceeded to biopsy of the right breast area in question. The pathology from this procedure showed ([id number]): Invasive ductal carcinoma, grade III. Prognostic indicators significant for: both estrogen receptor and progesterone receptor, 0% negative. Proliferation marker Ki67 at 80%. HER2 amplified  with ratios HER2/CEP17 signals 3.28 and average HER2 copies per cell 4.10  The patient's subsequent history is as detailed below.  INTERVAL HISTORY: Leydy returns today for follow up and treatment of her estrogen receptor negative but HER2 amplified breast cancer. She is scheduled to start chemotherapy with carboplatin, docetaxel, trastuzumab, and pertuzumab on 07/16/2018 to be given every 21 days.  She has met with the chemotherapy teaching nurse and had an echocardiogram today, with results pending.  She is scheduled for a port placement  07/14/2018 and for CT of the chest and bone scan 07/15/2018.  She will also receive goserelin every 28 days, with the first dose due 07/17/2018   REVIEW OF SYSTEMS: Jadalee reports that she has been researching online and she has many questions about her treatment options. She read articles that notes that chemotherapy can cause cancer to spread. She, of course, feels that she wants to follow doctors' recommendations, but she is worried about the side affect of chemotherapy, including permanent hair loss. She is also wondering if she could receive immunotherapy without chemotherapy. She feels very protective of her body, and what goes into it. She notes that her father had cancer and he perferred not to have chemotherapy, and he died. She would also prefer not to have chemotherapy, but her goal is to survive her cancer.   She denies unusual headaches, visual changes, nausea, vomiting, or dizziness. There has been no unusual cough, phlegm production, or pleurisy. There has been no change in bowel or bladder habits. She denies unexplained fatigue or unexplained weight loss, bleeding, rash, or fever. A detailed review of systems was otherwise stable.    PAST MEDICAL HISTORY: Past Medical History:  Diagnosis Date  . Depression   . Diabetes mellitus without complication ([place])   Dysmenorrhea   PAST SURGICAL HISTORY: Past Surgical History:  Procedure Laterality Date  . INDUCED ABORTION      FAMILY HISTORY Family History  Problem Relation Age of Onset  . Diabetes Mother   . Hypertension Mother   . Diabetes Paternal Grandmother   . Breast cancer Neg Hx   The patient's father died at age 82 due to liver cancer. The patient's mother is alive at age 38 as of August 2019. The patient has 1 brother and 4  sisters. The patient denies a family history of breast or ovarian cancer in the family.   GYNECOLOGIC HISTORY:  Patient's last menstrual period was 06/17/2018 (approximate). Menarche: 42 years  old The patient has not carried a child to term.  She is having regular monthly periods that last 3 days and are not heavy, if she takes biotin. She used ortho tri-cyclen for birth control.     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 The patient's SO recently passed away from renal failure. Currently at home is the patient's sister visiting from [place] state and their mother who moved from [place]. The patient notes that this is a temporary arrangement.  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 The patient's mother can be reached at [phone].   HEALTH MAINTENANCE: Social History   Tobacco Use  . Smoking status: Never Smoker  . Smokeless tobacco: Never Used  Substance Use Topics  . Alcohol use: No  . Drug use: Not Currently     Colonoscopy:  PAP: December 2016  Bone density:   No Known Allergies  Current Outpatient Medications  Medication Sig Dispense Refill  . diazepam (VALIUM) 5 MG tablet Take 1 tablet (5 mg total) by mouth every 6 (six) hours as needed for anxiety. 2 tablet 0  . metFORMIN (GLUCOPHAGE) 500 MG tablet Take 1 tablet (500 mg total) by mouth 2 (two) times daily with a meal. (Patient not taking: Reported on 06/24/2018) 60 tablet 0   No current facility-administered medications for this visit.     OBJECTIVE: Morbidly obese African-American woman who appears stated age  42:   07/11/18 1349  BP: 135/81  Pulse: 72  Resp: 19  Temp: 99.1 F (37.3 C)  SpO2: 100%     Body mass index is 40.52 kg/m.   Wt Readings from Last 3 Encounters:  07/11/18 258 lb 11.2 oz (117.3 kg)  07/02/18 258 lb 12.8 oz (117.4 kg)  05/12/15 248 lb 4.8 oz (112.6 kg)      ECOG FS:1 - Symptomatic but completely ambulatory  Sclerae  unicteric, EOMs intact Oropharynx clear and moist No cervical or supraclavicular adenopathy Lungs no rales or rhonchi Heart regular rate and rhythm Abd soft, obese, nontender, positive bowel sounds MSK no focal spinal tenderness, no upper extremity lymphedema Neuro: nonfocal, well oriented, appropriate affect Breasts: Deferred   LAB RESULTS:  CMP     Component Value Date/Time   NA 139 07/02/2018 1218   K 4.1 07/02/2018 1218   CL 103 07/02/2018 1218   CO2 25 07/02/2018 1218   GLUCOSE 226 (H) 07/02/2018 1218   BUN 7 07/02/2018 1218   CREATININE 0.85 07/02/2018 1218   CALCIUM 8.7 (L) 07/02/2018 1218   PROT 7.0 07/02/2018 1218   ALBUMIN 3.4 (L) 07/02/2018 1218   AST 17 07/02/2018 1218   ALT 13 07/02/2018 1218   ALKPHOS 88 07/02/2018 1218   BILITOT 0.3 07/02/2018 1218   GFRNONAA >60 07/02/2018 1218   GFRAA >60 07/02/2018 1218    No results found for: TOTALPROTELP, ALBUMINELP, A1GS, A2GS, BETS, BETA2SER, GAMS, MSPIKE, SPEI  No results found for: KPAFRELGTCHN, LAMBDASER, KAPLAMBRATIO  Lab Results  Component Value Date   WBC 6.1 07/02/2018   NEUTROABS 3.2 07/02/2018   HGB  11.3 (L) 07/02/2018   HCT 35.6 07/02/2018   MCV 81.3 07/02/2018   PLT 312 07/02/2018    @LASTCHEMISTRY @  No results found for: LABCA2  No components found for: DXAJOI786  No results for input(s): INR in the last 168 hours.  No results found for: LABCA2  No results found for: VEH209  No results found for: OBS962  No results found for: EZM629  No results found for: CA2729  No components found for: HGQUANT  No results found for: CEA1 / No results found for: CEA1   No results found for: AFPTUMOR  No results found for: CHROMOGRNA  No results found for: PSA1  No visits with results within 3 Day(s) from this visit.  Latest known visit with results is:  Appointment on 07/02/2018  Component Date Value Ref Range Status  . Cholesterol 07/02/2018 154  0 - 200 mg/dL Final  . Triglycerides  07/02/2018 171* <150 mg/dL Final  . HDL 07/02/2018 52  >40 mg/dL Final  . Total CHOL/HDL Ratio 07/02/2018 3.0  RATIO Final  . VLDL 07/02/2018 34  0 - 40 mg/dL Final  . LDL Cholesterol 07/02/2018 68  0 - 99 mg/dL Final   Comment:        Total Cholesterol/HDL:CHD Risk Coronary Heart Disease Risk Table                     Men   Women  1/2 Average Risk   3.4   3.3  Average Risk       5.0   4.4  2 X Average Risk   9.6   7.1  3 X Average Risk  23.4   11.0        Use the calculated Patient Ratio above and the CHD Risk Table to determine the patient's CHD Risk.        ATP III CLASSIFICATION (LDL):  <100     mg/dL   Optimal  100-129  mg/dL   Near or Above                    Optimal  130-159  mg/dL   Borderline  160-189  mg/dL   High  >190     mg/dL   Very High Performed at [facility] [address] [place], [place] [address]   . Hgb A1c MFr Bld 07/02/2018 8.9* 4.8 - 5.6 % Final   Comment: (NOTE) Pre diabetes:          5.7%-6.4% Diabetes:              >6.4% Glycemic control for   <7.0% adults with diabetes   . Mean Plasma Glucose 07/02/2018 208.73  mg/dL Final   Performed at [facility] [address] [place],  [address]  . Sodium 07/02/2018 139  135 - 145 mmol/L Final  . Potassium 07/02/2018 4.1  3.5 - 5.1 mmol/L Final  . Chloride 07/02/2018 103  98 - 111 mmol/L Final  . CO2 07/02/2018 25  22 - 32 mmol/L Final  . Glucose, Bld 07/02/2018 226* 70 - 99 mg/dL Final  . BUN 07/02/2018 7  6 - 20 mg/dL Final  . Creatinine 07/02/2018 0.85  0.44 - 1.00 mg/dL Final  . Calcium 07/02/2018 8.7* 8.9 - 10.3 mg/dL Final  . Total Protein 07/02/2018 7.0  6.5 - 8.1 g/dL Final  . Albumin 07/02/2018 3.4* 3.5 - 5.0 g/dL Final  . AST 07/02/2018 17  15 - 41 U/L  Final  . ALT 07/02/2018 13  0 - 44 U/L Final  . Alkaline Phosphatase 07/02/2018 88  38 - 126 U/L Final  . Total Bilirubin 07/02/2018 0.3  0.3 - 1.2 mg/dL Final  . GFR, Est Non Af Am 07/02/2018 >60  >60  mL/min Final  . GFR, Est AFR Am 07/02/2018 >60  >60 mL/min Final   Comment: (NOTE) The eGFR has been calculated using the CKD EPI equation. This calculation has not been validated in all clinical situations. eGFR's persistently <60 mL/min signify possible Chronic Kidney Disease.   Georgiann Hahn gap 07/02/2018 11  5 - 15 Final   Performed at [facility], [place] [address], [place],  [place] [address]  . WBC Count 07/02/2018 6.1  3.9 - 10.3 K/uL Final  . RBC 07/02/2018 4.38  3.70 - 5.45 MIL/uL Final  . Hemoglobin 07/02/2018 11.3* 11.6 - 15.9 g/dL Final  . HCT 07/02/2018 35.6  34.8 - 46.6 % Final  . MCV 07/02/2018 81.3  79.5 - 101.0 fL Final  . MCH 07/02/2018 25.8  25.1 - 34.0 pg Final  . MCHC 07/02/2018 31.7  31.5 - 36.0 g/dL Final  . RDW 07/02/2018 14.4  11.2 - 14.5 % Final  . Platelet Count 07/02/2018 312  145 - 400 K/uL Final  . Neutrophils Relative % 07/02/2018 52  % Final  . Neutro Abs 07/02/2018 3.2  1.5 - 6.5 K/uL Final  . Lymphocytes Relative 07/02/2018 36  % Final  . Lymphs Abs 07/02/2018 2.2  0.9 - 3.3 K/uL Final  . Monocytes Relative 07/02/2018 9  % Final  . Monocytes Absolute 07/02/2018 0.5  0.1 - 0.9 K/uL Final  . Eosinophils Relative 07/02/2018 3  % Final  . Eosinophils Absolute 07/02/2018 0.2  0.0 - 0.5 K/uL Final  . Basophils Relative 07/02/2018 0  % Final  . Basophils Absolute 07/02/2018 0.0  0.0 - 0.1 K/uL Final   Performed at [facility], [address] [place], [place] [address]    (this displays the last labs from the last 3 days)  No results found for: TOTALPROTELP, ALBUMINELP, A1GS, A2GS, BETS, BETA2SER, GAMS, MSPIKE, SPEI (this displays SPEP labs)  No results found for: KPAFRELGTCHN, LAMBDASER, KAPLAMBRATIO (kappa/lambda light chains)  No results found for: HGBA, HGBA2QUANT, HGBFQUANT, HGBSQUAN (Hemoglobinopathy evaluation)   No results found for: LDH  No results found for: IRON, TIBC, IRONPCTSAT (Iron and  TIBC)  No results found for: FERRITIN  Urinalysis    Component Value Date/Time   COLORURINE YELLOW 04/21/2015 [place] 04/21/2015 1039   LABSPEC 1.040 (H) 04/21/2015 1039   PHURINE 5.5 04/21/2015 1039   GLUCOSEU >1000 (A) 04/21/2015 1039   HGBUR NEGATIVE 04/21/2015 [place] 04/21/2015 1039   KETONESUR NEGATIVE 04/21/2015 [place] 04/21/2015 1039   UROBILINOGEN 0.2 04/21/2015 1039   NITRITE NEGATIVE 04/21/2015 [place] 04/21/2015 1039     STUDIES: US Breast Ltd Uni Right Inc Axilla  Result Date: 06/24/2018 CLINICAL DATA:  RIGHT breast lump noted 3 weeks ago. EXAM: DIGITAL DIAGNOSTIC BILATERAL MAMMOGRAM WITH CAD AND TOMO ULTRASOUND RIGHT BREAST COMPARISON:  05/16/2015 ACR Breast Density Category b: There are scattered areas of fibroglandular density. FINDINGS: There is an indistinct mass in the UPPER-OUTER QUADRANT of the RIGHT breast, marked as palpable with BB. LEFT breast is negative. Mammographic images were processed with CAD. On physical exam, I palpate a discrete mass in the 10 o'clock location  of the RIGHT breast 7 centimeters from the nipple. Targeted ultrasound is performed, showing irregular hypoechoic mass with hyperechoic margins and significant internal vascularity in the 10 o'clock location 7 centimeters from the RIGHT nipple. There are mixed posterior acoustic features. Evaluation of the RIGHT axilla there is multiple mildly prominent lymph nodes which are symmetric compared to the nodes in the LEFT axilla. No suspicious lymph nodes are identified sonographically. IMPRESSION: 1. Suspicious mass in the 10 o'clock location of the RIGHT breast. 2. No RIGHT axillary adenopathy. RECOMMENDATION: Ultrasound-guided core biopsy is recommended. This will be performed later today and dictated separately. I have discussed the findings and recommendations with the patient. Results were also provided in writing at the  conclusion of the visit. If applicable, a reminder letter will be sent to the patient regarding the next appointment. BI-RADS CATEGORY  5: Highly suggestive of malignancy. Electronically Signed   By: Nolon Nations M.D.   On: 06/24/2018 14:50   Mm Diag Breast Tomo Bilateral  Result Date: 06/24/2018 CLINICAL DATA:  RIGHT breast lump noted 3 weeks ago. EXAM: DIGITAL DIAGNOSTIC BILATERAL MAMMOGRAM WITH CAD AND TOMO ULTRASOUND RIGHT BREAST COMPARISON:  05/16/2015 ACR Breast Density Category b: There are scattered areas of fibroglandular density. FINDINGS: There is an indistinct mass in the UPPER-OUTER QUADRANT of the RIGHT breast, marked as palpable with BB. LEFT breast is negative. Mammographic images were processed with CAD. On physical exam, I palpate a discrete mass in the 10 o'clock location of the RIGHT breast 7 centimeters from the nipple. Targeted ultrasound is performed, showing irregular hypoechoic mass with hyperechoic margins and significant internal vascularity in the 10 o'clock location 7 centimeters from the RIGHT nipple. There are mixed posterior acoustic features. Evaluation of the RIGHT axilla there is multiple mildly prominent lymph nodes which are symmetric compared to the nodes in the LEFT axilla. No suspicious lymph nodes are identified sonographically. IMPRESSION: 1. Suspicious mass in the 10 o'clock location of the RIGHT breast. 2. No RIGHT axillary adenopathy. RECOMMENDATION: Ultrasound-guided core biopsy is recommended. This will be performed later today and dictated separately. I have discussed the findings and recommendations with the patient. Results were also provided in writing at the conclusion of the visit. If applicable, a reminder letter will be sent to the patient regarding the next appointment. BI-RADS CATEGORY  5: Highly suggestive of malignancy. Electronically Signed   By: Nolon Nations M.D.   On: 06/24/2018 14:50   Mm Clip Placement Right  Result Date:  06/24/2018 CLINICAL DATA:  Post biopsy mammogram of the right breast for clip placement. EXAM: DIAGNOSTIC RIGHT MAMMOGRAM POST ULTRASOUND BIOPSY COMPARISON:  Previous exam(s). FINDINGS: Mammographic images were obtained following ultrasound guided biopsy of a right breast mass at 10 o'clock. The ribbon shaped biopsy marking clip is well positioned at the site of biopsy at 10 o'clock. IMPRESSION: Appropriate positioning of the ribbon shaped biopsy marking clip in the right breast mass at 10 o'clock. Final Assessment: Post Procedure Mammograms for Marker Placement Electronically Signed   By: Ammie Ferrier M.D.   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 This was found to be concordant by Dr. Ammie Ferrier. Pathology results were discussed with the patient by telephone. The patient reported doing well after the biopsy with tenderness at the site. Post biopsy  instructions and care were reviewed and questions were answered. The patient was encouraged to call [facility] for any additional concerns. The patient was referred to [facility] on July 02, 2018. Pathology results reported by Terie Purser, RN on 06/25/2018. Electronically Signed   By: Ammie Ferrier M.D.   On: 06/25/2018 12:47   Result Date: 06/25/2018 CLINICAL DATA:  42 year old female presenting for ultrasound-guided biopsy of a palpable right breast mass. EXAM: ULTRASOUND GUIDED RIGHT BREAST CORE NEEDLE BIOPSY COMPARISON:  Previous exam(s). FINDINGS: I met with the patient and we discussed the procedure of ultrasound-guided biopsy, including benefits and alternatives. We discussed the high likelihood of a successful procedure. We discussed the  risks of the procedure, including infection, bleeding, tissue injury, clip migration, and inadequate sampling. Informed written consent was given. The usual time-out protocol was performed immediately prior to the procedure. Lesion quadrant: Upper-outer quadrant Using sterile technique and 1% Lidocaine as local anesthetic, under direct ultrasound visualization, a 14 gauge spring-loaded device was used to perform biopsy of a mass in the right breast at 10 o'clock using an inferior approach. At the conclusion of the procedure a ribbon shaped tissue marker clip was deployed into the biopsy cavity. Follow up 2 view mammogram was performed and dictated separately. IMPRESSION: Ultrasound guided biopsy of a mass in the right breast at 10 o'clock. No apparent complications. Electronically Signed: By: Ammie Ferrier M.D. On: 06/24/2018 16:21    ELIGIBLE FOR AVAILABLE RESEARCH PROTOCOL: UPBEAT  ASSESSMENT: 42 y.o. [place], [place] woman status post right breast upper outer quadrant biopsy 06/24/2018 for a clinical T2N0, stage Ib invasive ductal carcinoma, grade 3, estrogen and progesterone receptor negative, HER-2 amplified, with an MIB-1 of 80%.  (1) genetics testing pending  (2) goserelin for ovarian function preservation  (3) neoadjuvant chemotherapy to consist of trastuzumab and Pertuzumab given every 21 days x 6, starting 07/17/2018  (a) weekly paclitaxel to start 07/24/2018, repeated x12  (4) continue trastuzumab and Pertuzumab to complete 12 months  (a) echocardiogram 07/11/2018 results pending   (5) definitive surgery to follow  (6) adjuvant radiation to follow surgery  PLAN: I spent more than 60 minutes face to face with Mariaisabel with more than 50% of that time spent in counseling and coordination of care.  This situation is very complicated.  To the best of our ability to tell, with staging scans pending, this patient has disease which is curable with standard of care.  In fact the chance of  cure with standard of care is likely greater than 80%.  She has been reading articles from good magazines written by good officers, suggesting various possible hypophysis, such as for example that chemotherapy may stimulate the peritumoral environment so that there is increased inflammation and this can cause spread of the tumor.  She is going by anecdotal history from her own experience and from the media, showing women who were treated for breast cancer and apparently cured but then had very late recurrences.  She concludes from that that no one gets cured from this cancer.  She is appropriately concerned regarding side effects and has done quite a bit of research regarding that however it is understandably difficult for her to appreciate the fact that simply because a side effect is possible does not mean that it will occur.  For example the possibility of permanent hair loss from Taxotere is remote, although certainly not 0.  Nevertheless this  is a very heavy weight in her planning.  She tells me that she really does want to live and it is not the case that she would rather die than receive chemotherapy.  She would agree to receive chemotherapy she says if it would be completely safe and if we could guarantee that it would cure her.  We had to go through some basic epidemiology so that she would understand the difference between a hypothesis generating article and an article that establishes the standard of care.  We went through the basic studies that establish first of all that chemotherapy does improve survival in breast cancer, and then that one particular type of chemotherapy may work better than another, and finally that there is no better proven therapy than the current standard of care.  What she would like to do she says is received the anti-HER-2 treatment and the antiestrogen pills" see what happens".  Unfortunately if what happens is that she develops metastatic disease then her disease would  no longer be curable.    It is confusing to her that we do use exactly those treatments and stage IV disease.  In that case however the goal is control not cure.  We discussed the 3 options.  One is what we consider the current standard in her situation namely docetaxel, carboplatin, trastuzumab and Pertuzumab as planned.  Because of her concern regarding the docetaxel, we could certainly do cyclophosphamide and doxorubicin x4 followed by carboplatin and paclitaxel x12 together with trastuzumab and Pertuzumab.  We discussed the fact that if she had a T1 tumor she would be a candidate for paclitaxel with trastuzumab and Pertuzumab.  However because of the size of her tumor it would not have qualified for that study  After a very long discussion she decided what she wants to do is start the trastuzumab and Pertuzumab on 07/17/2018.  She will then receive paclitaxel on 07/24/2018.  If she tolerates it well then she will proceed to a total of 12 cycles of paclitaxel as tolerated.  Otherwise we will have to consider other options.  I have encouraged her to call with any further questions or concerns.  She has appointment on 07/16/2018 to discuss results of her staging studies and questions regarding how to take supportive medication, and then she will see me a week later at the start of her Taxol treatment.   Jayse Hodkinson, Virgie Dad, MD  07/11/18 2:41 PM Medical Oncology and Hematology [facility] [address] [place], [place] [address] Tel. [phone]    Fax. [phone]  Alice Rieger, am acting as scribe for Chauncey Cruel MD.  I, Lurline Del MD, have reviewed the above documentation for accuracy and completeness, and I agree with the above.

## 2018-07-10 NOTE — Telephone Encounter (Signed)
Tried to call regarding schedule left a message to stop by scheduling

## 2018-07-11 ENCOUNTER — Ambulatory Visit (HOSPITAL_COMMUNITY): Admission: RE | Admit: 2018-07-11 | Payer: PRIVATE HEALTH INSURANCE | Source: Ambulatory Visit

## 2018-07-11 ENCOUNTER — Telehealth: Payer: Self-pay | Admitting: Oncology

## 2018-07-11 ENCOUNTER — Other Ambulatory Visit: Payer: Self-pay | Admitting: *Deleted

## 2018-07-11 ENCOUNTER — Other Ambulatory Visit: Payer: Self-pay | Admitting: Student

## 2018-07-11 ENCOUNTER — Inpatient Hospital Stay: Payer: Medicaid Other

## 2018-07-11 ENCOUNTER — Inpatient Hospital Stay (HOSPITAL_BASED_OUTPATIENT_CLINIC_OR_DEPARTMENT_OTHER): Payer: Medicaid Other | Admitting: Oncology

## 2018-07-11 VITALS — BP 135/81 | HR 72 | Temp 99.1°F | Resp 19 | Ht 67.0 in | Wt 258.7 lb

## 2018-07-11 DIAGNOSIS — C50411 Malignant neoplasm of upper-outer quadrant of right female breast: Secondary | ICD-10-CM

## 2018-07-11 DIAGNOSIS — Z5112 Encounter for antineoplastic immunotherapy: Secondary | ICD-10-CM | POA: Diagnosis not present

## 2018-07-11 DIAGNOSIS — Z7189 Other specified counseling: Secondary | ICD-10-CM

## 2018-07-11 DIAGNOSIS — Z171 Estrogen receptor negative status [ER-]: Secondary | ICD-10-CM

## 2018-07-11 MED ORDER — PROCHLORPERAZINE MALEATE 10 MG PO TABS: 10.0000 mg | ORAL_TABLET | Freq: Four times a day (QID) | ORAL | 1 refills | Status: DC | PRN

## 2018-07-11 MED ORDER — LIDOCAINE-PRILOCAINE 2.5-2.5 % EX CREA: TOPICAL_CREAM | CUTANEOUS | 3 refills | Status: DC

## 2018-07-11 NOTE — Telephone Encounter (Signed)
Gave avs and calendar ° °

## 2018-07-11 NOTE — Progress Notes (Signed)
DISCONTINUE ON PATHWAY REGIMEN - Breast  No Medical Intervention - Off Treatment.  REASON: Other Reason PRIOR TREATMENT: Off Treatment  START OFF PATHWAY REGIMEN - Breast   OFF00020:Paclitaxel + Trastuzumab:   A cycle is every 28 days:     Paclitaxel      Trastuzumab-xxxx      Trastuzumab-xxxx   **Always confirm dose/schedule in your pharmacy ordering system**  Patient Characteristics: Preoperative or Nonsurgical Candidate (Clinical Staging), Neoadjuvant Therapy followed by Surgery, Invasive Disease, Chemotherapy, HER2 Positive, ER Negative/Unknown Therapeutic Status: Preoperative or Nonsurgical Candidate (Clinical Staging) AJCC M Category: cM0 AJCC Grade: G3 Breast Surgical Plan: Neoadjuvant Therapy followed by Surgery ER Status: Negative (-) AJCC 8 Stage Grouping: IIA HER2 Status: Positive (+) AJCC T Category: cT2 AJCC N Category: cN0 PR Status: Negative (-) Intent of Therapy: Curative Intent, Discussed with Patient

## 2018-07-11 NOTE — Progress Notes (Signed)
DISCONTINUE ON PATHWAY REGIMEN - Breast     A cycle is every 21 days:     Pertuzumab      Pertuzumab      Trastuzumab      Trastuzumab      Carboplatin      Docetaxel   **Always confirm dose/schedule in your pharmacy ordering system**  REASON: Other Reason PRIOR TREATMENT: [id number]: Docetaxel + Carboplatin + Trastuzumab + Pertuzumab (TCHP) q21 Days x 6 Cycles TREATMENT RESPONSE: Unable to Evaluate  Breast - No Medical Intervention - Off Treatment.  Patient Characteristics: Preoperative or Nonsurgical Candidate (Clinical Staging), Neoadjuvant Therapy followed by Surgery, Invasive Disease, Chemotherapy, HER2 Positive, ER Negative/Unknown Therapeutic Status: Preoperative or Nonsurgical Candidate (Clinical Staging) AJCC M Category: cM0 AJCC Grade: G3 Breast Surgical Plan: Neoadjuvant Therapy followed by Surgery ER Status: Negative (-) AJCC 8 Stage Grouping: IIA HER2 Status: Positive (+) AJCC T Category: cT2 AJCC N Category: cN0 PR Status: Negative (-)

## 2018-07-14 ENCOUNTER — Other Ambulatory Visit: Payer: Self-pay | Admitting: Oncology

## 2018-07-14 ENCOUNTER — Other Ambulatory Visit: Payer: Self-pay

## 2018-07-14 ENCOUNTER — Ambulatory Visit (HOSPITAL_COMMUNITY)
Admission: RE | Admit: 2018-07-14 | Discharge: 2018-07-14 | Disposition: A | Discharge status: Home / Self Care | Payer: Medicaid Other | Source: Ambulatory Visit | Attending: Oncology | Admitting: Oncology

## 2018-07-14 ENCOUNTER — Encounter (HOSPITAL_COMMUNITY): Payer: Self-pay

## 2018-07-14 ENCOUNTER — Encounter: Payer: Self-pay | Admitting: Oncology

## 2018-07-14 DIAGNOSIS — Z171 Estrogen receptor negative status [ER-]: Principal | ICD-10-CM

## 2018-07-14 DIAGNOSIS — E119 Type 2 diabetes mellitus without complications: Secondary | ICD-10-CM | POA: Diagnosis not present

## 2018-07-14 DIAGNOSIS — F329 Major depressive disorder, single episode, unspecified: Secondary | ICD-10-CM | POA: Diagnosis not present

## 2018-07-14 DIAGNOSIS — C50411 Malignant neoplasm of upper-outer quadrant of right female breast: Secondary | ICD-10-CM

## 2018-07-14 DIAGNOSIS — Z833 Family history of diabetes mellitus: Secondary | ICD-10-CM | POA: Diagnosis not present

## 2018-07-14 DIAGNOSIS — Z9889 Other specified postprocedural states: Secondary | ICD-10-CM | POA: Insufficient documentation

## 2018-07-14 DIAGNOSIS — Z7984 Long term (current) use of oral hypoglycemic drugs: Secondary | ICD-10-CM | POA: Insufficient documentation

## 2018-07-14 HISTORY — PX: IR IMAGING GUIDED PORT INSERTION: IMG5740

## 2018-07-14 LAB — CBC
HCT: 38.6 % (ref 36.0–46.0)
Hemoglobin: 12 g/dL (ref 12.0–15.0)
MCH: 25.4 pg — ABNORMAL LOW (ref 26.0–34.0)
MCHC: 31.1 g/dL (ref 30.0–36.0)
MCV: 81.8 fL (ref 78.0–100.0)
Platelets: 360 K/uL (ref 150–400)
RBC: 4.72 MIL/uL (ref 3.87–5.11)
RDW: 15 % (ref 11.5–15.5)
WBC: 5.8 K/uL (ref 4.0–10.5)

## 2018-07-14 LAB — APTT: aPTT: 31 s (ref 24–36)

## 2018-07-14 LAB — PROTIME-INR
INR: 1.08
Prothrombin Time: 13.9 s (ref 11.4–15.2)

## 2018-07-14 LAB — PREGNANCY, URINE: Preg Test, Ur: NEGATIVE

## 2018-07-14 LAB — GLUCOSE, CAPILLARY
Glucose-Capillary: 191 mg/dL — ABNORMAL HIGH (ref 70–99)
Glucose-Capillary: 150 mg/dL — ABNORMAL HIGH (ref 70–99)

## 2018-07-14 MED ORDER — MIDAZOLAM HCL 2 MG/2ML IJ SOLN
INTRAMUSCULAR | Status: AC
  Filled 2018-07-14: qty 2

## 2018-07-14 MED ORDER — SODIUM CHLORIDE 0.9 % IV SOLN: INTRAVENOUS | Status: DC

## 2018-07-14 MED ORDER — LIDOCAINE HCL 1 % IJ SOLN
INTRAMUSCULAR | Status: AC
  Filled 2018-07-14: qty 20

## 2018-07-14 MED ORDER — FENTANYL CITRATE (PF) 100 MCG/2ML IJ SOLN
INTRAMUSCULAR | Status: AC
  Filled 2018-07-14: qty 2

## 2018-07-14 MED ORDER — LIDOCAINE HCL (PF) 1 % IJ SOLN
INTRAMUSCULAR | Status: AC | PRN
  Administered 2018-07-14: 10 mL

## 2018-07-14 MED ORDER — MIDAZOLAM HCL 2 MG/2ML IJ SOLN
INTRAMUSCULAR | Status: AC | PRN
  Administered 2018-07-14 (×2): 0.5 mg via INTRAVENOUS
  Administered 2018-07-14 (×2): 1 mg via INTRAVENOUS

## 2018-07-14 MED ORDER — CEFAZOLIN SODIUM-DEXTROSE 2-4 GM/100ML-% IV SOLN
INTRAVENOUS | Status: AC
  Filled 2018-07-14: qty 100

## 2018-07-14 MED ORDER — CEFAZOLIN SODIUM-DEXTROSE 2-4 GM/100ML-% IV SOLN
2.0000 g | Freq: Once | INTRAVENOUS | Status: AC
  Administered 2018-07-14: 2 g via INTRAVENOUS

## 2018-07-14 MED ORDER — HEPARIN SOD (PORK) LOCK FLUSH 100 UNIT/ML IV SOLN
INTRAVENOUS | Status: AC | PRN
  Administered 2018-07-14: 500 [IU] via INTRAVENOUS

## 2018-07-14 MED ORDER — HEPARIN SOD (PORK) LOCK FLUSH 100 UNIT/ML IV SOLN
INTRAVENOUS | Status: AC
  Filled 2018-07-14: qty 5

## 2018-07-14 MED ORDER — FENTANYL CITRATE (PF) 100 MCG/2ML IJ SOLN
INTRAMUSCULAR | Status: AC | PRN
  Administered 2018-07-14: 25 ug via INTRAVENOUS
  Administered 2018-07-14: 50 ug via INTRAVENOUS
  Administered 2018-07-14: 25 ug via INTRAVENOUS

## 2018-07-14 NOTE — Progress Notes (Signed)
I just received a note that Tara Gomez failed to show for her echocardiogram.  If we do not have an echo we will not be able to treat her on 07/17/2018 which is the plan.  I called her at home and her mother answer the phone but apparently the patient was not there although this was the patient's mobile.  We will continue to try to reach her.

## 2018-07-14 NOTE — Procedures (Signed)
[place] PAC SVC RA EBL 0 Comp 0

## 2018-07-14 NOTE — H&P (Signed)
Chief Complaint: Patient was seen in consultation today for [facility] a cath placement at the request of Magrinat,[name]  Referring Physician(s): Chauncey Cruel  Supervising Physician: Marybelle Killings  Patient Status: [facility] - Out-pt  History of Present Illness: Tara Gomez is a 42 y.o. female   Newly diagnosed Breast Ca July 2019 To start chemo therapy Thur Need PAC for same    Past Medical History:  Diagnosis Date  . Depression   . Diabetes mellitus without complication [facility])     Past Surgical History:  Procedure Laterality Date  . INDUCED ABORTION      Allergies: Patient has no known allergies.  Medications: Prior to Admission medications   Medication Sig Start Date End Date Taking? Authorizing Provider  diazepam (VALIUM) 5 MG tablet Take 1 tablet (5 mg total) by mouth every 6 (six) hours as needed for anxiety. 07/09/18   Magrinat, Virgie Dad, MD  lidocaine-prilocaine (EMLA) cream Apply to affected area once 07/11/18   Magrinat, Virgie Dad, MD  metFORMIN (GLUCOPHAGE) 500 MG tablet Take 1 tablet (500 mg total) by mouth 2 (two) times daily with a meal. Patient not taking: Reported on 06/24/2018 04/21/15   Jola Schmidt, MD  prochlorperazine (COMPAZINE) 10 MG tablet Take 1 tablet (10 mg total) by mouth every 6 (six) hours as needed (Nausea or vomiting). 07/11/18   Magrinat, Virgie Dad, MD     Family History  Problem Relation Age of Onset  . Diabetes Mother   . Hypertension Mother   . Diabetes Paternal Grandmother   . Breast cancer Neg Hx     Social History   Socioeconomic History  . Marital status: Single    Spouse name: Not on file  . Number of children: Not on file  . Years of education: Not on file  . Highest education level: Not on file  Occupational History  . Not on file  Social Needs  . Financial resource strain: Not on file  . Food insecurity:    Worry: Not on file    Inability: Not on file  . Transportation needs:    Medical: Not on file   Non-medical: Not on file  Tobacco Use  . Smoking status: Never Smoker  . Smokeless tobacco: Never Used  Substance and Sexual Activity  . Alcohol use: No  . Drug use: Not Currently  . Sexual activity: Yes    Birth control/protection: None, Pill  Lifestyle  . Physical activity:    Days per week: Not on file    Minutes per session: Not on file  . Stress: Not on file  Relationships  . Social connections:    Talks on phone: Not on file    Gets together: Not on file    Attends religious service: Not on file    Active member of club or organization: Not on file    Attends meetings of clubs or organizations: Not on file    Relationship status: Not on file  Other Topics Concern  . Not on file  Social History Narrative  . Not on file    Review of Systems: A 12 point ROS discussed and pertinent positives are indicated in the HPI above.  All other systems are negative.  Review of Systems  Constitutional: Negative for activity change, fatigue and unexpected weight change.  Respiratory: Negative for shortness of breath.   Cardiovascular: Negative for chest pain.  Gastrointestinal: Negative for abdominal pain.  Neurological: Negative for weakness.  Psychiatric/Behavioral: Negative for behavioral problems and confusion.  Vital Signs: BP 119/82 (BP Location: Right Arm)   Pulse 82   Temp 98.2 F (36.8 C) (Oral)   Ht 5' 7"  (1.702 m)   Wt 258 lb (117 kg)   LMP 06/17/2018 (Approximate)   SpO2 96%   BMI 40.41 kg/m   Physical Exam  Constitutional: She is oriented to person, place, and time.  Cardiovascular: Normal rate, regular rhythm and normal heart sounds.  Pulmonary/Chest: Effort normal and breath sounds normal.  Abdominal: Soft. Bowel sounds are normal.  Musculoskeletal: Normal range of motion.  Neurological: She is alert and oriented to person, place, and time.  Skin: Skin is warm and dry.  Psychiatric: She has a normal mood and affect. Her behavior is normal. Judgment and  thought content normal.  Nursing note and vitals reviewed.   Imaging: US Breast Ltd Uni Right Inc Axilla  Result Date: 06/24/2018 CLINICAL DATA:  RIGHT breast lump noted 3 weeks ago. EXAM: DIGITAL DIAGNOSTIC BILATERAL MAMMOGRAM WITH CAD AND TOMO ULTRASOUND RIGHT BREAST COMPARISON:  05/16/2015 ACR Breast Density Category b: There are scattered areas of fibroglandular density. FINDINGS: There is an indistinct mass in the UPPER-OUTER QUADRANT of the RIGHT breast, marked as palpable with BB. LEFT breast is negative. Mammographic images were processed with CAD. On physical exam, I palpate a discrete mass in the 10 o'clock location of the RIGHT breast 7 centimeters from the nipple. Targeted ultrasound is performed, showing irregular hypoechoic mass with hyperechoic margins and significant internal vascularity in the 10 o'clock location 7 centimeters from the RIGHT nipple. There are mixed posterior acoustic features. Evaluation of the RIGHT axilla there is multiple mildly prominent lymph nodes which are symmetric compared to the nodes in the LEFT axilla. No suspicious lymph nodes are identified sonographically. IMPRESSION: 1. Suspicious mass in the 10 o'clock location of the RIGHT breast. 2. No RIGHT axillary adenopathy. RECOMMENDATION: Ultrasound-guided core biopsy is recommended. This will be performed later today and dictated separately. I have discussed the findings and recommendations with the patient. Results were also provided in writing at the conclusion of the visit. If applicable, a reminder letter will be sent to the patient regarding the next appointment. BI-RADS CATEGORY  5: Highly suggestive of malignancy. Electronically Signed   By: Nolon Nations M.D.   On: 06/24/2018 14:50   Mm Diag Breast Tomo Bilateral  Result Date: 06/24/2018 CLINICAL DATA:  RIGHT breast lump noted 3 weeks ago. EXAM: DIGITAL DIAGNOSTIC BILATERAL MAMMOGRAM WITH CAD AND TOMO ULTRASOUND RIGHT BREAST COMPARISON:  05/16/2015 ACR  Breast Density Category b: There are scattered areas of fibroglandular density. FINDINGS: There is an indistinct mass in the UPPER-OUTER QUADRANT of the RIGHT breast, marked as palpable with BB. LEFT breast is negative. Mammographic images were processed with CAD. On physical exam, I palpate a discrete mass in the 10 o'clock location of the RIGHT breast 7 centimeters from the nipple. Targeted ultrasound is performed, showing irregular hypoechoic mass with hyperechoic margins and significant internal vascularity in the 10 o'clock location 7 centimeters from the RIGHT nipple. There are mixed posterior acoustic features. Evaluation of the RIGHT axilla there is multiple mildly prominent lymph nodes which are symmetric compared to the nodes in the LEFT axilla. No suspicious lymph nodes are identified sonographically. IMPRESSION: 1. Suspicious mass in the 10 o'clock location of the RIGHT breast. 2. No RIGHT axillary adenopathy. RECOMMENDATION: Ultrasound-guided core biopsy is recommended. This will be performed later today and dictated separately. I have discussed the findings and recommendations with the patient. Results were  also provided in writing at the conclusion of the visit. If applicable, a reminder letter will be sent to the patient regarding the next appointment. BI-RADS CATEGORY  5: Highly suggestive of malignancy. Electronically Signed   By: Nolon Nations M.D.   On: 06/24/2018 14:50   Mm Clip Placement Right  Result Date: 06/24/2018 CLINICAL DATA:  Post biopsy mammogram of the right breast for clip placement. EXAM: DIAGNOSTIC RIGHT MAMMOGRAM POST ULTRASOUND BIOPSY COMPARISON:  Previous exam(s). FINDINGS: Mammographic images were obtained following ultrasound guided biopsy of a right breast mass at 10 o'clock. The ribbon shaped biopsy marking clip is well positioned at the site of biopsy at 10 o'clock. IMPRESSION: Appropriate positioning of the ribbon shaped biopsy marking clip in the right breast mass  at 10 o'clock. Final Assessment: Post Procedure Mammograms for Marker Placement Electronically Signed   By: Ammie Ferrier M.D.   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 This was found to be concordant by Dr. Ammie Ferrier. Pathology results were discussed with the patient by telephone. The patient reported doing well after the biopsy with tenderness at the site. Post biopsy instructions and care were reviewed and questions were answered. The patient was encouraged to call [facility] for any additional concerns. The patient was referred to [facility] on July 02, 2018. Pathology results reported by Terie Purser, RN on 06/25/2018. Electronically Signed   By: Ammie Ferrier M.D.   On: 06/25/2018 12:47   Result Date: 06/25/2018 CLINICAL DATA:  42 year old female presenting for ultrasound-guided biopsy of a palpable right breast mass. EXAM: ULTRASOUND GUIDED RIGHT BREAST CORE NEEDLE BIOPSY COMPARISON:  Previous exam(s). FINDINGS: I met with the patient and we discussed the procedure of ultrasound-guided biopsy, including benefits and alternatives. We discussed the high likelihood of a successful procedure. We discussed the risks of the procedure, including infection, bleeding, tissue injury, clip migration, and inadequate sampling. Informed written consent was given. The usual time-out protocol was performed immediately prior to the procedure. Lesion quadrant: Upper-outer quadrant Using sterile technique and 1% Lidocaine as local anesthetic, under direct ultrasound visualization, a 14 gauge spring-loaded device was used to perform biopsy of a mass in the right breast at 10 o'clock using an inferior  approach. At the conclusion of the procedure a ribbon shaped tissue marker clip was deployed into the biopsy cavity. Follow up 2 view mammogram was performed and dictated separately. IMPRESSION: Ultrasound guided biopsy of a mass in the right breast at 10 o'clock. No apparent complications. Electronically Signed: By: Ammie Ferrier M.D. On: 06/24/2018 16:21    Labs:  CBC: Recent Labs    07/02/18 1218  WBC 6.1  HGB 11.3*  HCT 35.6  PLT 312    COAGS: No results for input(s): INR, APTT in the last 8760 hours.  BMP: Recent Labs    07/02/18 1218  NA 139  K 4.1  CL 103  CO2 25  GLUCOSE 226*  BUN 7  CALCIUM 8.7*  CREATININE 0.85  GFRNONAA >60  GFRAA >60    LIVER FUNCTION TESTS: Recent Labs    07/02/18 1218  BILITOT 0.3  AST 17  ALT 13  ALKPHOS 88  PROT 7.0  ALBUMIN 3.4*  TUMOR MARKERS: No results for input(s): AFPTM, CEA, CA199, CHROMGRNA in the last 8760 hours.  Assessment and Plan:  Breast Ca Start Chemo this week Scheduled for Port A Cath placement in IR Risks and benefits of image guided port-a-catheter placement was discussed with the patient including, but not limited to bleeding, infection, pneumothorax, or fibrin sheath development and need for additional procedures.  All of the patient's questions were answered, patient is agreeable to proceed. Consent signed and in chart.   Thank you for this interesting consult.  I greatly enjoyed meeting Sharlee Rufino and look forward to participating in their care.  A copy of this report was sent to the requesting provider on this date.  Electronically Signed: Lavonia Drafts, PA-C 07/14/2018, 11:55 AM   I spent a total of  30 Minutes   in face to face in clinical consultation, greater than 50% of which was counseling/coordinating care for [facility] placement

## 2018-07-14 NOTE — Progress Notes (Signed)
Pt is approved for the $1000 [employer] grant.  

## 2018-07-14 NOTE — Discharge Instructions (Signed)
Implanted Port Home Guide °An implanted port is a type of central line that is placed under the skin. Central lines are used to provide IV access when treatment or nutrition needs to be given through a person’s veins. Implanted ports are used for long-term IV access. An implanted port may be placed because: °· You need IV medicine that would be irritating to the small veins in your hands or arms. °· You need long-term IV medicines, such as antibiotics. °· You need IV nutrition for a long period. °· You need frequent blood draws for lab tests. °· You need dialysis. ° °Implanted ports are usually placed in the chest area, but they can also be placed in the upper arm, the abdomen, or the leg. An implanted port has two main parts: °· Reservoir. The reservoir is round and will appear as a small, raised area under your skin. The reservoir is the part where a needle is inserted to give medicines or draw blood. °· Catheter. The catheter is a thin, flexible tube that extends from the reservoir. The catheter is placed into a large vein. Medicine that is inserted into the reservoir goes into the catheter and then into the vein. ° °How will I care for my incision site? °Do not get the incision site wet. Bathe or shower as directed by your health care provider. °How is my port accessed? °Special steps must be taken to access the port: °· Before the port is accessed, a numbing cream can be placed on the skin. This helps numb the skin over the port site. °· Your health care provider uses a sterile technique to access the port. °? Your health care provider must put on a mask and sterile gloves. °? The skin over your port is cleaned carefully with an antiseptic and allowed to dry. °? The port is gently pinched between sterile gloves, and a needle is inserted into the port. °· Only "non-coring" port needles should be used to access the port. Once the port is accessed, a blood return should be checked. This helps ensure that the port  is in the vein and is not clogged. °· If your port needs to remain accessed for a constant infusion, a clear (transparent) bandage will be placed over the needle site. The bandage and needle will need to be changed every week, or as directed by your health care provider. °· Keep the bandage covering the needle clean and dry. Do not get it wet. Follow your health care provider’s instructions on how to take a shower or bath while the port is accessed. °· If your port does not need to stay accessed, no bandage is needed over the port. ° °What is flushing? °Flushing helps keep the port from getting clogged. Follow your health care provider’s instructions on how and when to flush the port. Ports are usually flushed with saline solution or a medicine called heparin. The need for flushing will depend on how the port is used. °· If the port is used for intermittent medicines or blood draws, the port will need to be flushed: °? After medicines have been given. °? After blood has been drawn. °? As part of routine maintenance. °· If a constant infusion is running, the port may not need to be flushed. ° °How long will my port stay implanted? °The port can stay in for as long as your health care provider thinks it is needed. When it is time for the port to come out, surgery will be   done to remove it. The procedure is similar to the one performed when the port was put in. When should I seek immediate medical care? When you have an implanted port, you should seek immediate medical care if:  You notice a bad smell coming from the incision site.  You have swelling, redness, or drainage at the incision site.  You have more swelling or pain at the port site or the surrounding area.  You have a fever that is not controlled with medicine.  This information is not intended to replace advice given to you by your health care provider. Make sure you discuss any questions you have with your health care provider. Document  Released: 11/12/2005 Document Revised: 04/19/2016 Document Reviewed: 07/20/2013 Elsevier Interactive Patient Education  2017 [place]. Moderate Conscious Sedation, Adult Sedation is the use of medicines to promote relaxation and relieve discomfort and anxiety. Moderate conscious sedation is a type of sedation. Under moderate conscious sedation, you are less alert than normal, but you are still able to respond to instructions, touch, or both. Moderate conscious sedation is used during short medical and dental procedures. It is milder than deep sedation, which is a type of sedation under which you cannot be easily woken up. It is also milder than general anesthesia, which is the use of medicines to make you unconscious. Moderate conscious sedation allows you to return to your regular activities sooner. Tell a health care provider about:  Any allergies you have.  All medicines you are taking, including vitamins, herbs, eye drops, creams, and over-the-counter medicines.  Use of steroids (by mouth or creams).  Any problems you or family members have had with sedatives and anesthetic medicines.  Any blood disorders you have.  Any surgeries you have had.  Any medical conditions you have, such as sleep apnea.  Whether you are pregnant or may be pregnant.  Any use of cigarettes, alcohol, marijuana, or street drugs. What are the risks? Generally, this is a safe procedure. However, problems may occur, including:  Getting too much medicine (oversedation).  Nausea.  Allergic reaction to medicines.  Trouble breathing. If this happens, a breathing tube may be used to help with breathing. It will be removed when you are awake and breathing on your own.  Heart trouble.  Lung trouble.  What happens before the procedure? Staying hydrated Follow instructions from your health care provider about hydration, which may include:  Up to 2 hours before the procedure - you may continue to drink  clear liquids, such as water, clear fruit juice, black coffee, and plain tea.  Eating and drinking restrictions Follow instructions from your health care provider about eating and drinking, which may include:  8 hours before the procedure - stop eating heavy meals or foods such as meat, fried foods, or fatty foods.  6 hours before the procedure - stop eating light meals or foods, such as toast or cereal.  6 hours before the procedure - stop drinking milk or drinks that contain milk.  2 hours before the procedure - stop drinking clear liquids.  Medicine  Ask your health care provider about:  Changing or stopping your regular medicines. This is especially important if you are taking diabetes medicines or blood thinners.  Taking medicines such as aspirin and ibuprofen. These medicines can thin your blood. Do not take these medicines before your procedure if your health care provider instructs you not to.  Tests and exams  You will have a physical exam.  You may  have blood tests done to show: ? How well your kidneys and liver are working. ? How well your blood can clot. General instructions  Plan to have someone take you home from the hospital or clinic.  If you will be going home right after the procedure, plan to have someone with you for 24 hours. What happens during the procedure?  An IV tube will be inserted into one of your veins.  Medicine to help you relax (sedative) will be given through the IV tube.  The medical or dental procedure will be performed. What happens after the procedure?  Your blood pressure, heart rate, breathing rate, and blood oxygen level will be monitored often until the medicines you were given have worn off.  Do not drive for 24 hours. This information is not intended to replace advice given to you by your health care provider. Make sure you discuss any questions you have with your health care provider. Document Released: 08/07/2001 Document  Revised: 04/17/2016 Document Reviewed: 03/03/2016 Elsevier Interactive Patient Education  [employer].

## 2018-07-15 ENCOUNTER — Encounter (HOSPITAL_COMMUNITY): Payer: Self-pay

## 2018-07-15 ENCOUNTER — Ambulatory Visit (HOSPITAL_COMMUNITY)
Admission: RE | Admit: 2018-07-15 | Discharge: 2018-07-15 | Disposition: A | Discharge status: Home / Self Care | Payer: Medicaid Other | Source: Ambulatory Visit | Attending: Oncology | Admitting: Oncology

## 2018-07-15 ENCOUNTER — Encounter (HOSPITAL_COMMUNITY)
Admission: RE | Admit: 2018-07-15 | Discharge: 2018-07-15 | Disposition: A | Discharge status: Home / Self Care | Payer: Medicaid Other | Source: Ambulatory Visit | Attending: Oncology | Admitting: Oncology

## 2018-07-15 ENCOUNTER — Inpatient Hospital Stay (HOSPITAL_COMMUNITY): Admission: RE | Admit: 2018-07-15 | Payer: PRIVATE HEALTH INSURANCE | Source: Ambulatory Visit

## 2018-07-15 ENCOUNTER — Ambulatory Visit (HOSPITAL_BASED_OUTPATIENT_CLINIC_OR_DEPARTMENT_OTHER)
Admission: RE | Admit: 2018-07-15 | Discharge: 2018-07-15 | Disposition: A | Discharge status: Home / Self Care | Payer: Medicaid Other | Source: Ambulatory Visit | Attending: Oncology | Admitting: Oncology

## 2018-07-15 DIAGNOSIS — Z6841 Body Mass Index (BMI) 40.0 and over, adult: Secondary | ICD-10-CM | POA: Diagnosis not present

## 2018-07-15 DIAGNOSIS — E669 Obesity, unspecified: Secondary | ICD-10-CM | POA: Diagnosis not present

## 2018-07-15 DIAGNOSIS — Z171 Estrogen receptor negative status [ER-]: Secondary | ICD-10-CM | POA: Diagnosis present

## 2018-07-15 DIAGNOSIS — C50411 Malignant neoplasm of upper-outer quadrant of right female breast: Secondary | ICD-10-CM

## 2018-07-15 DIAGNOSIS — E119 Type 2 diabetes mellitus without complications: Secondary | ICD-10-CM | POA: Diagnosis not present

## 2018-07-15 MED ORDER — IOHEXOL 300 MG/ML  SOLN
75.0000 mL | Freq: Once | INTRAMUSCULAR | Status: AC | PRN
  Administered 2018-07-15: 75 mL via INTRAVENOUS

## 2018-07-15 MED ORDER — TECHNETIUM TC 99M MEDRONATE IV KIT
20.0000 | PACK | Freq: Once | INTRAVENOUS | Status: AC | PRN
  Administered 2018-07-15: 20 via INTRAVENOUS

## 2018-07-15 NOTE — Progress Notes (Addendum)
[place]  [phone] [phone]     ID: Derotha Fishbaugh DOB: 20-Aug-1976  MR#: [id number]  TSV#:[id number]  Patient Care Team: Marylynn Pearson, MD as PCP - General (Obstetrics and Gynecology) Erroll Luna, MD as Consulting Physician (General Surgery) Magrinat, Virgie Dad, MD as Consulting Physician (Oncology) Kyung Rudd, MD as Consulting Physician (Radiation Oncology) OTHER MD:  CHIEF COMPLAINT: Estrogen receptor negative breast cancer  CURRENT TREATMENT: Neoadjuvant chemotherapy; goserelin   HISTORY OF CURRENT ILLNESS: From the original intake note:  Juliene palpated a mass in her right breast in early July 2019. She brought it to medical attention and underwent bilateral diagnostic mammography with tomography and right breast ultrasonography at [facility] on 06/24/2018 showing: breast density category B. There is a hypoechoic mass at the 10 o' clock right breast upper outer quadrant located 7 cm from the nipple. There were no suspicious lymph nodes in the right axilla.   Accordingly on 06/24/2018 she proceeded to biopsy of the right breast area in question. The pathology from this procedure showed ([id number]): Invasive ductal carcinoma, grade III. Prognostic indicators significant for: both estrogen receptor and progesterone receptor, 0% negative. Proliferation marker Ki67 at 80%. HER2 amplified  with ratios HER2/CEP17 signals 3.28 and average HER2 copies per cell 4.10  The patient's subsequent history is as detailed below.  INTERVAL HISTORY: Zyair returns today for follow up and treatment of her estrogen receptor negative but HER2 amplified breast cancer. She is scheduled to start chemotherapy with Paclitaxel, trastuzumab, and pertuzumab on 07/17/2018.    She will also receive goserelin every 28 days, with the first dose due 07/17/2018.    Sahily is again very distressed today because she thinks that the chemotherapy is going to kill  her.  She has received a second opinion.  She understands Dr. Virgie Dad recommendations are in line with the standard of care.  She wants to know if we have any open studies.  She noted a couple of people she knew who she stated the chemotherapy caused them to die, and caused their cancer to spread.     REVIEW OF SYSTEMS: Other than Trudie's psychological distress, she is feeling well.  She underwent her port placement.  She says she tolerated the port placement well.  Her echocardiogram was normal.  She underwent CT chest, bone scan.  There was mild uptake on her distal sternum.  She denies any new pain.  She denies any recent fevers or chills.  Her vision is normal.  She is feeling well otherwise and a detailed ROS is otherwise non contributory.     PAST MEDICAL HISTORY: Past Medical History:  Diagnosis Date  . Depression   . Diabetes mellitus without complication ([place])   Dysmenorrhea   PAST SURGICAL HISTORY: Past Surgical History:  Procedure Laterality Date  . INDUCED ABORTION    . IR IMAGING GUIDED PORT INSERTION  07/14/2018    FAMILY HISTORY Family History  Problem Relation Age of Onset  . Diabetes Mother   . Hypertension Mother   . Diabetes Paternal Grandmother   . Breast cancer Neg Hx   The patient's father died at age 82 due to liver cancer. The patient's mother is alive at age 59 as of August 2019. The patient has 1 brother and 4 sisters. The patient denies a family history of breast or ovarian cancer in the family.   GYNECOLOGIC HISTORY:  Patient's last menstrual period was 06/17/2018 (approximate). Menarche: 42 years old The patient has not carried a  child to term.  She is having regular monthly periods that last 3 days and are not heavy, if she takes biotin. She used ortho tri-cyclen for birth control.     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 The patient's SO recently passed  away from renal failure. Currently at home is the patient's sister visiting from [place] state and their mother who moved from [place]. The patient notes that this is a temporary arrangement.  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 The patient's mother can be reached at [phone].   HEALTH MAINTENANCE: Social History   Tobacco Use  . Smoking status: Never Smoker  . Smokeless tobacco: Never Used  Substance Use Topics  . Alcohol use: No  . Drug use: Not Currently     Colonoscopy:  PAP: December 2016  Bone density:   No Known Allergies  Current Outpatient Medications  Medication Sig Dispense Refill  . diazepam (VALIUM) 5 MG tablet Take 1 tablet (5 mg total) by mouth every 6 (six) hours as needed for anxiety. 2 tablet 0  . lidocaine-prilocaine (EMLA) cream Apply to affected area once 30 g 3  . metFORMIN (GLUCOPHAGE) 500 MG tablet Take 1 tablet (500 mg total) by mouth 2 (two) times daily with a meal. (Patient not taking: Reported on 06/24/2018) 60 tablet 0  . prochlorperazine (COMPAZINE) 10 MG tablet Take 1 tablet (10 mg total) by mouth every 6 (six) hours as needed (Nausea or vomiting). 30 tablet 1   No current facility-administered medications for this visit.     OBJECTIVE: Vitals:   07/16/18 0940  BP: 137/81  Pulse: 77  Resp: 18  Temp: 99.5 F (37.5 C)  SpO2: 98%     Body mass index is 40.05 kg/m.   Wt Readings from Last 3 Encounters:  07/16/18 255 lb 11.2 oz (116 kg)  07/14/18 258 lb (117 kg)  07/11/18 258 lb 11.2 oz (117.3 kg)  ECOG FS:1 - Symptomatic but completely ambulatory GENERAL: Patient is an anxious appearing female in no acute distress HEENT:  Sclerae anicteric.  Oropharynx clear and moist. No ulcerations or evidence of oropharyngeal candidiasis. Neck is supple.  NODES:  No cervical,  supraclavicular, or axillary lymphadenopathy palpated.  BREAST EXAM:  Deferred. LUNGS:  Clear to auscultation bilaterally.  No wheezes or rhonchi. HEART:  Regular rate and rhythm. No murmur appreciated. ABDOMEN:  Soft, nontender.  Positive, normoactive bowel sounds. No organomegaly palpated. MSK:  No focal spinal tenderness to palpation. Full range of motion bilaterally in the upper extremities. EXTREMITIES:  No peripheral edema.   SKIN:  Clear with no obvious rashes or skin changes. No nail dyscrasia. NEURO:  Nonfocal. Well oriented.  Distressed affect.     LAB RESULTS:  CMP     Component Value Date/Time   NA 139 07/02/2018 1218   K 4.1 07/02/2018 1218   CL 103 07/02/2018 1218   CO2 25 07/02/2018 1218   GLUCOSE 226 (H) 07/02/2018 1218   BUN 7 07/02/2018 1218   CREATININE 0.85 07/02/2018 1218   CALCIUM 8.7 (L) 07/02/2018 1218   PROT 7.0 07/02/2018 1218   ALBUMIN 3.4 (L) 07/02/2018 1218   AST 17 07/02/2018 1218   ALT 13 07/02/2018 1218   ALKPHOS 88  07/02/2018 1218   BILITOT 0.3 07/02/2018 1218   GFRNONAA >60 07/02/2018 1218   GFRAA >60 07/02/2018 1218    No results found for: TOTALPROTELP, ALBUMINELP, A1GS, A2GS, BETS, BETA2SER, GAMS, MSPIKE, SPEI  No results found for: Nils Pyle, [facility]  Lab Results  Component Value Date   WBC 5.8 07/14/2018   NEUTROABS 3.2 07/02/2018   HGB 12.0 07/14/2018   HCT 38.6 07/14/2018   MCV 81.8 07/14/2018   PLT 360 07/14/2018    @LASTCHEMISTRY @  No results found for: LABCA2  No components found for: PNTIRW431  Recent Labs  Lab 07/14/18 1230  INR 1.08    No results found for: LABCA2  No results found for: VQM086  No results found for: PYP950  No results found for: DTO671  No results found for: CA2729  No components found for: HGQUANT  No results found for: CEA1 / No results found for: CEA1   No results found for: AFPTUMOR  No results found for: [place]  No results found for:  [facility] Outpatient Visit on 07/14/2018  Component Date Value Ref Range Status  . Glucose-Capillary 07/14/2018 191* 70 - 99 mg/dL Final  . WBC 07/14/2018 5.8  4.0 - 10.5 K/uL Final  . RBC 07/14/2018 4.72  3.87 - 5.11 MIL/uL Final  . Hemoglobin 07/14/2018 12.0  12.0 - 15.0 g/dL Final  . HCT 07/14/2018 38.6  36.0 - 46.0 % Final  . MCV 07/14/2018 81.8  78.0 - 100.0 fL Final  . MCH 07/14/2018 25.4* 26.0 - 34.0 pg Final  . MCHC 07/14/2018 31.1  30.0 - 36.0 g/dL Final  . RDW 07/14/2018 15.0  11.5 - 15.5 % Final  . Platelets 07/14/2018 360  150 - 400 K/uL Final   Performed at [facility], [place] [address] [place], [place] [address]  . Preg Test, Ur 07/14/2018 NEGATIVE  NEGATIVE Final   Performed at [facility], [place] [address], [place], [place] [address]  . Glucose-Capillary 07/14/2018 150* 70 - 99 mg/dL Final  . Comment 1 07/14/2018 Notify RN   Final  . Prothrombin Time 07/14/2018 13.9  11.4 - 15.2 seconds Final  . INR 07/14/2018 1.08   Final   Performed at [facility], [place] [address] [place], [place] [address]  . aPTT 07/14/2018 31  24 - 36 seconds Final   Performed at [facility], [place] [address], [place], [place] [address]    (this displays the last labs from the last 3 days)  No results found for: TOTALPROTELP, ALBUMINELP, A1GS, A2GS, BETS, BETA2SER, GAMS, MSPIKE, SPEI (this displays SPEP labs)  No results found for: KPAFRELGTCHN, LAMBDASER, KAPLAMBRATIO (kappa/lambda light chains)  No results found for: HGBA, HGBA2QUANT, HGBFQUANT, HGBSQUAN (Hemoglobinopathy evaluation)   No results found for: LDH  No results found for: IRON, TIBC, IRONPCTSAT (Iron and TIBC)  No results found for: FERRITIN  Urinalysis    Component Value Date/Time   COLORURINE YELLOW 04/21/2015 [place] 04/21/2015 1039   LABSPEC 1.040 (H) 04/21/2015 1039   PHURINE 5.5 04/21/2015 1039   GLUCOSEU >1000 (A) 04/21/2015 1039   HGBUR NEGATIVE 04/21/2015 1039    BILIRUBINUR NEGATIVE 04/21/2015 1039   KETONESUR NEGATIVE 04/21/2015 1039   PROTEINUR NEGATIVE 04/21/2015 1039   UROBILINOGEN 0.2 04/21/2015 1039   NITRITE NEGATIVE 04/21/2015 [place] 04/21/2015 1039     STUDIES: Ct Chest W Contrast  Result Date: 07/15/2018 CLINICAL DATA:  Right breast cancer, staging. EXAM:  CT CHEST WITH CONTRAST TECHNIQUE: Multidetector CT imaging of the chest was performed during intravenous contrast administration. CONTRAST:  73m OMNIPAQUE IOHEXOL 300 MG/ML  SOLN COMPARISON:  None. FINDINGS: Cardiovascular: Normal heart size. No significant pericardial effusion/thickening. Left internal jugular MediPort terminates at the cavoatrial junction. Great vessels are normal in course and caliber. No central pulmonary emboli. Mediastinum/Nodes: No discrete thyroid nodules. Unremarkable esophagus. Mildly enlarged 1.1 cm right axillary node (series 2/image 61). No left axillary adenopathy. No pathologically enlarged mediastinal or hilar nodes. Lungs/Pleura: No pneumothorax. No pleural effusion. No acute consolidative airspace disease, lung masses or significant pulmonary nodules. Upper abdomen: Ill-defined mild subcapsular fat is partially visualized adjacent to the porta hepatis. Musculoskeletal: No aggressive appearing focal osseous lesions. Focal soft tissue asymmetry in the outer right breast (series 2/image 78). IMPRESSION: 1. Mild right axillary adenopathy. No additional findings of metastatic disease in the chest. 2. Focal soft tissue asymmetry in the outer right breast, correlating with known right breast cancer. Electronically Signed   By: JIlona SorrelM.D.   On: 07/15/2018 15:35   Nm Bone Scan Whole Body  Result Date: 07/15/2018 CLINICAL DATA:  Breast cancer question osseous metastatic disease EXAM: NUCLEAR MEDICINE WHOLE BODY BONE SCAN TECHNIQUE: Whole body anterior and posterior images were obtained approximately 3 hours after intravenous injection of  radiopharmaceutical. RADIOPHARMACEUTICALS:  20 mCi Technetium-942mDP IV COMPARISON:  None FINDINGS: Uptake at the shoulders, sternoclavicular joints, hips, knees and feet typically degenerative. Increased tracer localization at the distal sternum cannot exclude metastatic disease. Additional focus of uptake at the mid sternum likely represents physiologic uptake at the manubriosternal junction. No additional sites of abnormal osseous tracer localization identified to suggest metastatic disease. Uptake in the mandible question related to dental disease or dentures. Expected urinary tract and soft tissue distribution of tracer. IMPRESSION: Abnormal tracer uptake at the distal sternum question osseous metastatic disease. Electronically Signed   By: MaLavonia Dana.D.   On: 07/15/2018 17:31   [employer] Uni Right Inc Axilla  Result Date: 06/24/2018 CLINICAL DATA:  RIGHT breast lump noted 3 weeks ago. EXAM: DIGITAL DIAGNOSTIC BILATERAL MAMMOGRAM WITH CAD AND TOMO ULTRASOUND RIGHT BREAST COMPARISON:  05/16/2015 ACR Breast Density Category b: There are scattered areas of fibroglandular density. FINDINGS: There is an indistinct mass in the UPPER-OUTER QUADRANT of the RIGHT breast, marked as palpable with BB. LEFT breast is negative. Mammographic images were processed with CAD. On physical exam, I palpate a discrete mass in the 10 o'clock location of the RIGHT breast 7 centimeters from the nipple. Targeted ultrasound is performed, showing irregular hypoechoic mass with hyperechoic margins and significant internal vascularity in the 10 o'clock location 7 centimeters from the RIGHT nipple. There are mixed posterior acoustic features. Evaluation of the RIGHT axilla there is multiple mildly prominent lymph nodes which are symmetric compared to the nodes in the LEFT axilla. No suspicious lymph nodes are identified sonographically. IMPRESSION: 1. Suspicious mass in the 10 o'clock location of the RIGHT breast. 2. No RIGHT  axillary adenopathy. RECOMMENDATION: Ultrasound-guided core biopsy is recommended. This will be performed later today and dictated separately. I have discussed the findings and recommendations with the patient. Results were also provided in writing at the conclusion of the visit. If applicable, a reminder letter will be sent to the patient regarding the next appointment. BI-RADS CATEGORY  5: Highly suggestive of malignancy. Electronically Signed   By: ElNolon Nations.D.   On: 06/24/2018 14:50   Mm Diag Breast Tomo Bilateral  Result Date: 06/24/2018 CLINICAL DATA:  RIGHT breast lump noted 3 weeks ago. EXAM: DIGITAL DIAGNOSTIC BILATERAL MAMMOGRAM WITH CAD AND TOMO ULTRASOUND RIGHT BREAST COMPARISON:  05/16/2015 ACR Breast Density Category b: There are scattered areas of fibroglandular density. FINDINGS: There is an indistinct mass in the UPPER-OUTER QUADRANT of the RIGHT breast, marked as palpable with BB. LEFT breast is negative. Mammographic images were processed with CAD. On physical exam, I palpate a discrete mass in the 10 o'clock location of the RIGHT breast 7 centimeters from the nipple. Targeted ultrasound is performed, showing irregular hypoechoic mass with hyperechoic margins and significant internal vascularity in the 10 o'clock location 7 centimeters from the RIGHT nipple. There are mixed posterior acoustic features. Evaluation of the RIGHT axilla there is multiple mildly prominent lymph nodes which are symmetric compared to the nodes in the LEFT axilla. No suspicious lymph nodes are identified sonographically. IMPRESSION: 1. Suspicious mass in the 10 o'clock location of the RIGHT breast. 2. No RIGHT axillary adenopathy. RECOMMENDATION: Ultrasound-guided core biopsy is recommended. This will be performed later today and dictated separately. I have discussed the findings and recommendations with the patient. Results were also provided in writing at the conclusion of the visit. If applicable, a  reminder letter will be sent to the patient regarding the next appointment. BI-RADS CATEGORY  5: Highly suggestive of malignancy. Electronically Signed   By: Nolon Nations M.D.   On: 06/24/2018 14:50   Mm Clip Placement Right  Result Date: 06/24/2018 CLINICAL DATA:  Post biopsy mammogram of the right breast for clip placement. EXAM: DIAGNOSTIC RIGHT MAMMOGRAM POST ULTRASOUND BIOPSY COMPARISON:  Previous exam(s). FINDINGS: Mammographic images were obtained following ultrasound guided biopsy of a right breast mass at 10 o'clock. The ribbon shaped biopsy marking clip is well positioned at the site of biopsy at 10 o'clock. IMPRESSION: Appropriate positioning of the ribbon shaped biopsy marking clip in the right breast mass at 10 o'clock. Final Assessment: Post Procedure Mammograms for Marker Placement Electronically Signed   By: Ammie Ferrier M.D.   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 This was found to be concordant by Dr. Ammie Ferrier. Pathology results were discussed with the patient by telephone. The patient reported doing well after the biopsy with tenderness at the site. Post biopsy instructions and care were reviewed and questions were answered. The patient was encouraged to call [facility] for any additional concerns. The patient was referred to [facility] on July 02, 2018. Pathology results reported by Terie Purser, RN on 06/25/2018. Electronically Signed   By: Ammie Ferrier M.D.   On: 06/25/2018 12:47   Result Date: 06/25/2018 CLINICAL DATA:  42 year old female presenting for ultrasound-guided biopsy of a palpable right breast mass. EXAM: ULTRASOUND GUIDED RIGHT BREAST CORE NEEDLE  BIOPSY COMPARISON:  Previous exam(s). FINDINGS: I met with the patient and we discussed the procedure of ultrasound-guided biopsy, including benefits and alternatives. We discussed the high likelihood of a successful procedure. We discussed the risks of the procedure, including infection, bleeding, tissue injury, clip migration, and inadequate sampling. Informed written consent was given. The usual time-out protocol was performed immediately prior to the procedure. Lesion quadrant: Upper-outer quadrant Using sterile technique and 1% Lidocaine  as local anesthetic, under direct ultrasound visualization, a 14 gauge spring-loaded device was used to perform biopsy of a mass in the right breast at 10 o'clock using an inferior approach. At the conclusion of the procedure a ribbon shaped tissue marker clip was deployed into the biopsy cavity. Follow up 2 view mammogram was performed and dictated separately. IMPRESSION: Ultrasound guided biopsy of a mass in the right breast at 10 o'clock. No apparent complications. Electronically Signed: By: Ammie Ferrier M.D. On: 06/24/2018 16:21   Ir Imaging Guided Port Insertion  Result Date: 07/14/2018 CLINICAL DATA:  Breast cancer EXAM: TUNNEL POWER PORT PLACEMENT WITH SUBCUTANEOUS POCKET UTILIZING ULTRASOUND & FLOUROSCOPY FLUOROSCOPY TIME:  48 seconds.  Five mGy. MEDICATIONS AND MEDICAL HISTORY: Versed 3 mg, Fentanyl 100 mcg. Additional Medications: Ancef 2 g. Antibiotics were given within 2 hours of the procedure. ANESTHESIA/SEDATION: Moderate sedation time: 40 minutes. Nursing monitored the the patient during the procedure. PROCEDURE: After written informed consent was obtained, patient was placed in the supine position on angiographic table. The left neck and chest was prepped and draped in a sterile fashion. Lidocaine was utilized for local anesthesia. The left jugular vein was noted to be patent initially with ultrasound. Under sonographic guidance, a micropuncture needle  was inserted into the left IJ vein (Ultrasound and fluoroscopic image documentation was performed). The needle was removed over an 018 wire which was exchanged for a Amplatz. This was advanced into the IVC. An 8-French dilator was advanced over the Amplatz. A small incision was made in the left upper chest over the anterior right second rib. Utilizing blunt dissection, a subcutaneous pocket was created in the caudal direction. The pocket was irrigated with a copious amount of sterile normal saline. The port catheter was tunneled from the chest incision, and out the neck incision. The reservoir was inserted into the subcutaneous pocket and secured with two 3-0 Ethilon stitches. A peel-away sheath was advanced over the Amplatz wire. The port catheter was cut to measure length and inserted through the peel-away sheath. The peel-away sheath was removed. The chest incision was closed with 3-0 Vicryl interrupted stitches for the subcutaneous tissue and a running of 4-0 Vicryl subcuticular stitch for the skin. The neck incision was closed with a 4-0 Vicryl subcuticular stitch. Derma-bond was applied to both surgical incisions. The port reservoir was flushed and instilled with heparinized saline. No complications. FINDINGS: A left IJ vein Port-A-Cath is in place with its tip at the cavoatrial junction. COMPLICATIONS: None IMPRESSION: Successful 8 French left internal jugular vein power port placement with its tip at the SVC/RA junction. Electronically Signed   By: Marybelle Killings M.D.   On: 07/14/2018 15:37    ELIGIBLE FOR AVAILABLE RESEARCH PROTOCOL: UPBEAT  ASSESSMENT: 42 y.o. [place], [place] woman status post right breast upper outer quadrant biopsy 06/24/2018 for a clinical T2N0, stage Ib invasive ductal carcinoma, grade 3, estrogen and progesterone receptor negative, HER-2 amplified, with an MIB-1 of 80%.  (1) genetics testing pending  (2) goserelin for ovarian function preservation  (3) neoadjuvant chemotherapy  to consist of trastuzumab and Pertuzumab given every 21 days x 6, starting 07/17/2018  (a) weekly paclitaxel to start 07/24/2018, repeated x12  (b) Bone scan shows uptake at distal sternum that will be monitored, CT chest negative for distant metastases  (4) continue trastuzumab and Pertuzumab to complete 12 months  (a) echocardiogram 07/15/2018 EF 55-60%  (5) definitive surgery to follow  (6) adjuvant radiation to follow surgery  PLAN:  Mozetta is  terrified about receiving chemotherapy.  She really wants to make the best decision for her, and is struggling with this.  I continued to recommend standard of care that was previously recommended by Dr. Jana Hakim which is Docetaxel, Carboplatin, Trastuzumab and Pertuzumab, or Doxorubicin, Cyclophosphamide, Paclitaxel, Trastuzumab.  After discussion with both myself and Dr. Jana Hakim she agrees to Paclitaxel, Trastuzumab, Pertuzumab tomorrow.  I reviewed how to take her Prochlorperazine, and encouraged her to pick this up from her pharmacy today.    Cherilyn will return later today for labs and then tomorrow for treatment.  We couldn't do the labs during the same time as her appointment because she was late and has to go onto have her breast MRI.    October knows to call for any questions or concerns prior to her next appointment with [place].     Wilber Bihari, NP  07/16/18 10:17 AM Medical Oncology and Hematology [facility] [address] [place], [place] [address] Tel. [phone]    Fax. [phone]   ADDENDUM: He has been difficult to get Hayzel to accept any treatment and as is she is not accepting standard therapy.  At least she does agree to paclitaxel with anti-HER-2 immunotherapy.  I am hopeful she will tolerate this well.  We can always consider intensifying treatment by adding carboplatin, or optimally switching to carboplatin docetaxel  She tells me her visiting sister is telling me if she received any chemotherapy she  will die within a week or 2.  This is not helpful.  She is scheduled for breast MRI later this week and I will see her again with her second dose of paclitaxel to troubleshoot side effects.  I personally saw this patient and performed a substantive portion of this encounter with the listed APP documented above.   Chauncey Cruel, MD Medical Oncology and Hematology [facility] [address] [place], [place] [address] Tel. [phone]    Fax. [phone]

## 2018-07-15 NOTE — Progress Notes (Signed)
  Echocardiogram 2D Echocardiogram has been performed.  Janat Tabbert L Androw 07/15/2018, 10:01 AM

## 2018-07-16 ENCOUNTER — Inpatient Hospital Stay: Payer: Medicaid Other

## 2018-07-16 ENCOUNTER — Ambulatory Visit
Admission: RE | Admit: 2018-07-16 | Discharge: 2018-07-16 | Disposition: A | Discharge status: Home / Self Care | Payer: PRIVATE HEALTH INSURANCE | Source: Ambulatory Visit | Attending: Oncology | Admitting: Oncology

## 2018-07-16 ENCOUNTER — Other Ambulatory Visit: Payer: Self-pay | Admitting: Oncology

## 2018-07-16 ENCOUNTER — Inpatient Hospital Stay (HOSPITAL_BASED_OUTPATIENT_CLINIC_OR_DEPARTMENT_OTHER): Payer: Medicaid Other | Admitting: Adult Health

## 2018-07-16 ENCOUNTER — Encounter: Payer: Self-pay | Admitting: Adult Health

## 2018-07-16 VITALS — BP 137/81 | HR 77 | Temp 99.5°F | Resp 18 | Ht 67.0 in | Wt 255.7 lb

## 2018-07-16 DIAGNOSIS — Z171 Estrogen receptor negative status [ER-]: Secondary | ICD-10-CM

## 2018-07-16 DIAGNOSIS — Z5112 Encounter for antineoplastic immunotherapy: Secondary | ICD-10-CM | POA: Diagnosis not present

## 2018-07-16 DIAGNOSIS — C50411 Malignant neoplasm of upper-outer quadrant of right female breast: Secondary | ICD-10-CM

## 2018-07-16 DIAGNOSIS — Z7189 Other specified counseling: Secondary | ICD-10-CM

## 2018-07-16 DIAGNOSIS — E119 Type 2 diabetes mellitus without complications: Secondary | ICD-10-CM

## 2018-07-16 DIAGNOSIS — Z6841 Body Mass Index (BMI) 40.0 and over, adult: Secondary | ICD-10-CM

## 2018-07-16 LAB — COMPREHENSIVE METABOLIC PANEL
ALT: 10 U/L (ref 0–44)
AST: 14 U/L — ABNORMAL LOW (ref 15–41)
Albumin: 3.7 g/dL (ref 3.5–5.0)
Alkaline Phosphatase: 90 U/L (ref 38–126)
Anion gap: 10 (ref 5–15)
BUN: 7 mg/dL (ref 6–20)
CO2: 23 mmol/L (ref 22–32)
Calcium: 9.1 mg/dL (ref 8.9–10.3)
Chloride: 106 mmol/L (ref 98–111)
Creatinine, Ser: 0.76 mg/dL (ref 0.44–1.00)
GFR calc Af Amer: >60 mL/min (ref >60)
GFR calc non Af Amer: >60 mL/min (ref >60)
Glucose, Bld: 141 mg/dL — ABNORMAL HIGH (ref 70–99)
Potassium: 4 mmol/L (ref 3.5–5.1)
Sodium: 139 mmol/L (ref 135–145)
Total Bilirubin: 0.3 mg/dL (ref 0.3–1.2)
Total Protein: 7.5 g/dL (ref 6.5–8.1)

## 2018-07-16 LAB — CBC WITH DIFFERENTIAL/PLATELET
Basophils Absolute: 0 10*3/uL (ref 0.0–0.1)
Basophils Relative: 1 %
Eosinophils Absolute: 0.1 10*3/uL (ref 0.0–0.5)
Eosinophils Relative: 2 %
HCT: 35.7 % (ref 34.8–46.6)
Hemoglobin: 11.6 g/dL (ref 11.6–15.9)
Lymphocytes Relative: 35 %
Lymphs Abs: 2 10*3/uL (ref 0.9–3.3)
MCH: 25.4 pg (ref 25.1–34.0)
MCHC: 32.5 g/dL (ref 31.5–36.0)
MCV: 78.1 fL — ABNORMAL LOW (ref 79.5–101.0)
Monocytes Absolute: 0.5 10*3/uL (ref 0.1–0.9)
Monocytes Relative: 9 %
Neutro Abs: 3 10*3/uL (ref 1.5–6.5)
Neutrophils Relative %: 53 %
Platelets: 343 10*3/uL (ref 145–400)
RBC: 4.57 MIL/uL (ref 3.70–5.45)
RDW: 14.8 % — ABNORMAL HIGH (ref 11.2–14.5)
WBC: 5.7 10*3/uL (ref 3.9–10.3)

## 2018-07-16 MED ORDER — GADOBENATE DIMEGLUMINE 529 MG/ML IV SOLN
20.0000 mL | Freq: Once | INTRAVENOUS | Status: AC | PRN
  Administered 2018-07-16: 20 mL via INTRAVENOUS

## 2018-07-17 ENCOUNTER — Ambulatory Visit: Payer: PRIVATE HEALTH INSURANCE

## 2018-07-17 ENCOUNTER — Other Ambulatory Visit: Payer: PRIVATE HEALTH INSURANCE

## 2018-07-17 ENCOUNTER — Inpatient Hospital Stay: Payer: Medicaid Other

## 2018-07-17 ENCOUNTER — Other Ambulatory Visit: Payer: Self-pay | Admitting: Oncology

## 2018-07-17 VITALS — BP 124/84 | HR 99 | Temp 99.1°F | Resp 16

## 2018-07-17 DIAGNOSIS — Z5112 Encounter for antineoplastic immunotherapy: Secondary | ICD-10-CM | POA: Diagnosis not present

## 2018-07-17 DIAGNOSIS — Z171 Estrogen receptor negative status [ER-]: Principal | ICD-10-CM

## 2018-07-17 DIAGNOSIS — C50411 Malignant neoplasm of upper-outer quadrant of right female breast: Secondary | ICD-10-CM

## 2018-07-17 MED ORDER — LORAZEPAM 2 MG/ML IJ SOLN
INTRAMUSCULAR | Status: AC
  Filled 2018-07-17: qty 1

## 2018-07-17 MED ORDER — DIPHENHYDRAMINE HCL 25 MG PO CAPS
ORAL_CAPSULE | ORAL | Status: AC
  Filled 2018-07-17: qty 1

## 2018-07-17 MED ORDER — FAMOTIDINE IN NACL 20-0.9 MG/50ML-% IV SOLN
20.0000 mg | Freq: Once | INTRAVENOUS | Status: AC
  Administered 2018-07-17: 20 mg via INTRAVENOUS

## 2018-07-17 MED ORDER — HEPARIN SOD (PORK) LOCK FLUSH 100 UNIT/ML IV SOLN
500.0000 [IU] | Freq: Once | INTRAVENOUS | Status: DC | PRN
  Filled 2018-07-17: qty 5

## 2018-07-17 MED ORDER — DIPHENHYDRAMINE HCL 25 MG PO CAPS
25.0000 mg | ORAL_CAPSULE | Freq: Once | ORAL | Status: AC
  Administered 2018-07-17: 25 mg via ORAL

## 2018-07-17 MED ORDER — DIPHENHYDRAMINE HCL 50 MG/ML IJ SOLN
INTRAMUSCULAR | Status: AC
  Filled 2018-07-17: qty 1

## 2018-07-17 MED ORDER — SODIUM CHLORIDE 0.9 % IV SOLN
20.0000 mg | Freq: Once | INTRAVENOUS | Status: AC
  Administered 2018-07-17: 20 mg via INTRAVENOUS
  Filled 2018-07-17: qty 2

## 2018-07-17 MED ORDER — SODIUM CHLORIDE 0.9 % IV SOLN
80.0000 mg/m2 | Freq: Once | INTRAVENOUS | Status: AC
  Administered 2018-07-17: 186 mg via INTRAVENOUS
  Filled 2018-07-17: qty 31

## 2018-07-17 MED ORDER — ACETAMINOPHEN 325 MG PO TABS
650.0000 mg | ORAL_TABLET | Freq: Once | ORAL | Status: AC
  Administered 2018-07-17: 650 mg via ORAL

## 2018-07-17 MED ORDER — DIPHENHYDRAMINE HCL 50 MG/ML IJ SOLN
25.0000 mg | Freq: Once | INTRAMUSCULAR | Status: AC
  Administered 2018-07-17: 25 mg via INTRAVENOUS

## 2018-07-17 MED ORDER — FAMOTIDINE IN NACL 20-0.9 MG/50ML-% IV SOLN
INTRAVENOUS | Status: AC
  Filled 2018-07-17: qty 50

## 2018-07-17 MED ORDER — TRASTUZUMAB CHEMO 150 MG IV SOLR
900.0000 mg | Freq: Once | INTRAVENOUS | Status: AC
  Administered 2018-07-17: 900 mg via INTRAVENOUS
  Filled 2018-07-17: qty 42.86

## 2018-07-17 MED ORDER — ACETAMINOPHEN 325 MG PO TABS
ORAL_TABLET | ORAL | Status: AC
  Filled 2018-07-17: qty 2

## 2018-07-17 MED ORDER — SODIUM CHLORIDE 0.9 % IV SOLN
420.0000 mg | Freq: Once | INTRAVENOUS | Status: AC
  Administered 2018-07-17: 420 mg via INTRAVENOUS
  Filled 2018-07-17: qty 14

## 2018-07-17 MED ORDER — SODIUM CHLORIDE 0.9% FLUSH
10.0000 mL | INTRAVENOUS | Status: DC | PRN
  Filled 2018-07-17: qty 10

## 2018-07-17 MED ORDER — SODIUM CHLORIDE 0.9 % IV SOLN
Freq: Once | INTRAVENOUS | Status: AC
  Administered 2018-07-17: 09:00:00 via INTRAVENOUS
  Filled 2018-07-17: qty 250

## 2018-07-17 MED ORDER — LORAZEPAM 2 MG/ML IJ SOLN
0.5000 mg | Freq: Once | INTRAMUSCULAR | Status: AC
  Administered 2018-07-17: 0.5 mg via INTRAVENOUS

## 2018-07-17 NOTE — Patient Instructions (Signed)
[facility] Discharge Instructions for Patients Receiving Chemotherapy  Today you received the following chemotherapy agents Herceptin, Perjeta, and Taxol  To help prevent nausea and vomiting after your treatment, we encourage you to take your nausea medication as directed   If you develop nausea and vomiting that is not controlled by your nausea medication, call the clinic.   BELOW ARE SYMPTOMS THAT SHOULD BE REPORTED IMMEDIATELY:  *FEVER GREATER THAN 100.5 F  *CHILLS WITH OR WITHOUT FEVER  NAUSEA AND VOMITING THAT IS NOT CONTROLLED WITH YOUR NAUSEA MEDICATION  *UNUSUAL SHORTNESS OF BREATH  *UNUSUAL BRUISING OR BLEEDING  TENDERNESS IN MOUTH AND THROAT WITH OR WITHOUT PRESENCE OF ULCERS  *URINARY PROBLEMS  *BOWEL PROBLEMS  UNUSUAL RASH Items with * indicate a potential emergency and should be followed up as soon as possible.  Feel free to call the clinic should you have any questions or concerns. The clinic phone number is [phone].  Please show the [facility] at check-in to the Emergency Department and triage nurse.   Trastuzumab (Herceptin) injection for infusion What is this medicine? TRASTUZUMAB (tras TOO zoo mab) is a monoclonal antibody. It is used to treat breast cancer and stomach cancer. This medicine may be used for other purposes; ask your health care provider or pharmacist if you have questions. COMMON BRAND NAME(S): Herceptin What should I tell my health care provider before I take this medicine? They need to know if you have any of these conditions: -heart disease -heart failure -lung or breathing disease, like asthma -an unusual or allergic reaction to trastuzumab, benzyl alcohol, or other medications, foods, dyes, or preservatives -pregnant or trying to get pregnant -breast-feeding How should I use this medicine? This drug is given as an infusion into a vein. It is administered in a hospital or clinic by a specially trained  health care professional. Talk to your pediatrician regarding the use of this medicine in children. This medicine is not approved for use in children. Overdosage: If you think you have taken too much of this medicine contact a poison control center or emergency room at once. NOTE: This medicine is only for you. Do not share this medicine with others. What if I miss a dose? It is important not to miss a dose. Call your doctor or health care professional if you are unable to keep an appointment. What may interact with this medicine? This medicine may interact with the following medications: -certain types of chemotherapy, such as daunorubicin, doxorubicin, epirubicin, and idarubicin This list may not describe all possible interactions. Give your health care provider a list of all the medicines, herbs, non-prescription drugs, or dietary supplements you use. Also tell them if you smoke, drink alcohol, or use illegal drugs. Some items may interact with your medicine. What should I watch for while using this medicine? Visit your doctor for checks on your progress. Report any side effects. Continue your course of treatment even though you feel ill unless your doctor tells you to stop. Call your doctor or health care professional for advice if you get a fever, chills or sore throat, or other symptoms of a cold or flu. Do not treat yourself. Try to avoid being around people who are sick. You may experience fever, chills and shaking during your first infusion. These effects are usually mild and can be treated with other medicines. Report any side effects during the infusion to your health care professional. Fever and chills usually do not happen with later infusions.  Do not become pregnant while taking this medicine or for 7 months after stopping it. Women should inform their doctor if they wish to become pregnant or think they might be pregnant. Women of child-bearing potential will need to have a negative  pregnancy test before starting this medicine. There is a potential for serious side effects to an unborn child. Talk to your health care professional or pharmacist for more information. Do not breast-feed an infant while taking this medicine or for 7 months after stopping it. Women must use effective birth control with this medicine. What side effects may I notice from receiving this medicine? Side effects that you should report to your doctor or health care professional as soon as possible: -allergic reactions like skin rash, itching or hives, swelling of the face, lips, or tongue -chest pain or palpitations -cough -dizziness -feeling faint or lightheaded, falls -fever -general ill feeling or flu-like symptoms -signs of worsening heart failure like breathing problems; swelling in your legs and feet -unusually weak or tired Side effects that usually do not require medical attention (report to your doctor or health care professional if they continue or are bothersome): -bone pain -changes in taste -diarrhea -joint pain -nausea/vomiting -weight loss This list may not describe all possible side effects. Call your doctor for medical advice about side effects. You may report side effects to FDA at 1-800-FDA-1088. Where should I keep my medicine? This drug is given in a hospital or clinic and will not be stored at home. NOTE: This sheet is a summary. It may not cover all possible information. If you have questions about this medicine, talk to your doctor, pharmacist, or health care provider.  2018 Elsevier/Gold Standard (2016-11-06 14:37:52)   Pertuzumab (Perjeta) injection What is this medicine? PERTUZUMAB (per TOOZ ue mab) is a monoclonal antibody. It is used to treat breast cancer. This medicine may be used for other purposes; ask your health care provider or pharmacist if you have questions. COMMON BRAND NAME(S): PERJETA What should I tell my health care provider before I take this  medicine? They need to know if you have any of these conditions: -heart disease -heart failure -high blood pressure -history of irregular heart beat -recent or ongoing radiation therapy -an unusual or allergic reaction to pertuzumab, other medicines, foods, dyes, or preservatives -pregnant or trying to get pregnant -breast-feeding How should I use this medicine? This medicine is for infusion into a vein. It is given by a health care professional in a hospital or clinic setting. Talk to your pediatrician regarding the use of this medicine in children. Special care may be needed. Overdosage: If you think you have taken too much of this medicine contact a poison control center or emergency room at once. NOTE: This medicine is only for you. Do not share this medicine with others. What if I miss a dose? It is important not to miss your dose. Call your doctor or health care professional if you are unable to keep an appointment. What may interact with this medicine? Interactions are not expected. Give your health care provider a list of all the medicines, herbs, non-prescription drugs, or dietary supplements you use. Also tell them if you smoke, drink alcohol, or use illegal drugs. Some items may interact with your medicine. This list may not describe all possible interactions. Give your health care provider a list of all the medicines, herbs, non-prescription drugs, or dietary supplements you use. Also tell them if you smoke, drink alcohol, or use illegal drugs.  Some items may interact with your medicine. What should I watch for while using this medicine? Your condition will be monitored carefully while you are receiving this medicine. Report any side effects. Continue your course of treatment even though you feel ill unless your doctor tells you to stop. Do not become pregnant while taking this medicine or for 7 months after stopping it. Women should inform their doctor if they wish to become  pregnant or think they might be pregnant. Women of child-bearing potential will need to have a negative pregnancy test before starting this medicine. There is a potential for serious side effects to an unborn child. Talk to your health care professional or pharmacist for more information. Do not breast-feed an infant while taking this medicine or for 7 months after stopping it. Women must use effective birth control with this medicine. Call your doctor or health care professional for advice if you get a fever, chills or sore throat, or other symptoms of a cold or flu. Do not treat yourself. Try to avoid being around people who are sick. You may experience fever, chills, and headache during the infusion. Report any side effects during the infusion to your health care professional. What side effects may I notice from receiving this medicine? Side effects that you should report to your doctor or health care professional as soon as possible: -breathing problems -chest pain or palpitations -dizziness -feeling faint or lightheaded -fever or chills -skin rash, itching or hives -sore throat -swelling of the face, lips, or tongue -swelling of the legs or ankles -unusually weak or tired Side effects that usually do not require medical attention (report to your doctor or health care professional if they continue or are bothersome): -diarrhea -hair loss -nausea, vomiting -tiredness This list may not describe all possible side effects. Call your doctor for medical advice about side effects. You may report side effects to FDA at 1-800-FDA-1088. Where should I keep my medicine? This drug is given in a hospital or clinic and will not be stored at home. NOTE: This sheet is a summary. It may not cover all possible information. If you have questions about this medicine, talk to your doctor, pharmacist, or health care provider.  2018 Elsevier/Gold Standard (2015-12-15 12:08:50)   Paclitaxel (Taxol)  injection What is this medicine? PACLITAXEL (PAK li TAX el) is a chemotherapy drug. It targets fast dividing cells, like cancer cells, and causes these cells to die. This medicine is used to treat ovarian cancer, breast cancer, and other cancers. This medicine may be used for other purposes; ask your health care provider or pharmacist if you have questions. COMMON BRAND NAME(S): Onxol, Taxol What should I tell my health care provider before I take this medicine? They need to know if you have any of these conditions: -blood disorders -irregular heartbeat -infection (especially a virus infection such as chickenpox, cold sores, or herpes) -liver disease -previous or ongoing radiation therapy -an unusual or allergic reaction to paclitaxel, alcohol, polyoxyethylated castor oil, other chemotherapy agents, other medicines, foods, dyes, or preservatives -pregnant or trying to get pregnant -breast-feeding How should I use this medicine? This drug is given as an infusion into a vein. It is administered in a hospital or clinic by a specially trained health care professional. Talk to your pediatrician regarding the use of this medicine in children. Special care may be needed. Overdosage: If you think you have taken too much of this medicine contact a poison control center or emergency room at once. NOTE:  This medicine is only for you. Do not share this medicine with others. What if I miss a dose? It is important not to miss your dose. Call your doctor or health care professional if you are unable to keep an appointment. What may interact with this medicine? Do not take this medicine with any of the following medications: -disulfiram -metronidazole This medicine may also interact with the following medications: -cyclosporine -diazepam -ketoconazole -medicines to increase blood counts like filgrastim, pegfilgrastim, sargramostim -other chemotherapy drugs like cisplatin, doxorubicin, epirubicin,  etoposide, teniposide, vincristine -quinidine -testosterone -vaccines -verapamil Talk to your doctor or health care professional before taking any of these medicines: -acetaminophen -aspirin -ibuprofen -ketoprofen -naproxen This list may not describe all possible interactions. Give your health care provider a list of all the medicines, herbs, non-prescription drugs, or dietary supplements you use. Also tell them if you smoke, drink alcohol, or use illegal drugs. Some items may interact with your medicine. What should I watch for while using this medicine? Your condition will be monitored carefully while you are receiving this medicine. You will need important blood work done while you are taking this medicine. This medicine can cause serious allergic reactions. To reduce your risk you will need to take other medicine(s) before treatment with this medicine. If you experience allergic reactions like skin rash, itching or hives, swelling of the face, lips, or tongue, tell your doctor or health care professional right away. In some cases, you may be given additional medicines to help with side effects. Follow all directions for their use. This drug may make you feel generally unwell. This is not uncommon, as chemotherapy can affect healthy cells as well as cancer cells. Report any side effects. Continue your course of treatment even though you feel ill unless your doctor tells you to stop. Call your doctor or health care professional for advice if you get a fever, chills or sore throat, or other symptoms of a cold or flu. Do not treat yourself. This drug decreases your body's ability to fight infections. Try to avoid being around people who are sick. This medicine may increase your risk to bruise or bleed. Call your doctor or health care professional if you notice any unusual bleeding. Be careful brushing and flossing your teeth or using a toothpick because you may get an infection or bleed more  easily. If you have any dental work done, tell your dentist you are receiving this medicine. Avoid taking products that contain aspirin, acetaminophen, ibuprofen, naproxen, or ketoprofen unless instructed by your doctor. These medicines may hide a fever. Do not become pregnant while taking this medicine. Women should inform their doctor if they wish to become pregnant or think they might be pregnant. There is a potential for serious side effects to an unborn child. Talk to your health care professional or pharmacist for more information. Do not breast-feed an infant while taking this medicine. Men are advised not to father a child while receiving this medicine. This product may contain alcohol. Ask your pharmacist or healthcare provider if this medicine contains alcohol. Be sure to tell all healthcare providers you are taking this medicine. Certain medicines, like metronidazole and disulfiram, can cause an unpleasant reaction when taken with alcohol. The reaction includes flushing, headache, nausea, vomiting, sweating, and increased thirst. The reaction can last from 30 minutes to several hours. What side effects may I notice from receiving this medicine? Side effects that you should report to your doctor or health care professional as soon as possible: -  allergic reactions like skin rash, itching or hives, swelling of the face, lips, or tongue -low blood counts - This drug may decrease the number of white blood cells, red blood cells and platelets. You may be at increased risk for infections and bleeding. -signs of infection - fever or chills, cough, sore throat, pain or difficulty passing urine -signs of decreased platelets or bleeding - bruising, pinpoint red spots on the skin, black, tarry stools, nosebleeds -signs of decreased red blood cells - unusually weak or tired, fainting spells, lightheadedness -breathing problems -chest pain -high or low blood pressure -mouth sores -nausea and  vomiting -pain, swelling, redness or irritation at the injection site -pain, tingling, numbness in the hands or feet -slow or irregular heartbeat -swelling of the ankle, feet, hands Side effects that usually do not require medical attention (report to your doctor or health care professional if they continue or are bothersome): -bone pain -complete hair loss including hair on your head, underarms, pubic hair, eyebrows, and eyelashes -changes in the color of fingernails -diarrhea -loosening of the fingernails -loss of appetite -muscle or joint pain -red flush to skin -sweating This list may not describe all possible side effects. Call your doctor for medical advice about side effects. You may report side effects to FDA at 1-800-FDA-1088. Where should I keep my medicine? This drug is given in a hospital or clinic and will not be stored at home. NOTE: This sheet is a summary. It may not cover all possible information. If you have questions about this medicine, talk to your doctor, pharmacist, or health care provider.  2018 Elsevier/Gold Standard (2015-09-13 19:58:00)

## 2018-07-17 NOTE — Progress Notes (Signed)
Per message from Dr. Jana Hakim, patient will receive goserelin injection 08/05/18. She has injection appointment for this date.

## 2018-07-18 ENCOUNTER — Telehealth: Payer: Self-pay | Admitting: Adult Health

## 2018-07-18 ENCOUNTER — Ambulatory Visit
Admission: RE | Admit: 2018-07-18 | Discharge: 2018-07-18 | Disposition: A | Discharge status: Home / Self Care | Payer: PRIVATE HEALTH INSURANCE | Source: Ambulatory Visit | Attending: Oncology | Admitting: Oncology

## 2018-07-18 DIAGNOSIS — Z171 Estrogen receptor negative status [ER-]: Principal | ICD-10-CM

## 2018-07-18 DIAGNOSIS — C50411 Malignant neoplasm of upper-outer quadrant of right female breast: Secondary | ICD-10-CM

## 2018-07-18 MED ORDER — GADOBENATE DIMEGLUMINE 529 MG/ML IV SOLN
20.0000 mL | Freq: Once | INTRAVENOUS | Status: AC | PRN
  Administered 2018-07-18: 20 mL via INTRAVENOUS

## 2018-07-18 NOTE — Telephone Encounter (Signed)
Per 8/21 los, no orders.

## 2018-07-21 ENCOUNTER — Telehealth: Payer: Self-pay | Admitting: Adult Health

## 2018-07-21 ENCOUNTER — Other Ambulatory Visit: Payer: Self-pay | Admitting: *Deleted

## 2018-07-21 DIAGNOSIS — Z171 Estrogen receptor negative status [ER-]: Principal | ICD-10-CM

## 2018-07-21 DIAGNOSIS — C50411 Malignant neoplasm of upper-outer quadrant of right female breast: Secondary | ICD-10-CM

## 2018-07-21 NOTE — Telephone Encounter (Signed)
Called both home and mobile to review MRI results.  LMOM on both numbers.  Wilber Bihari, NP

## 2018-07-23 NOTE — Progress Notes (Signed)
[name]  [phone] [phone]     ID: Tara Gomez DOB: Jul 24, 1976  MR#: [id number]  KXF#:[id number]  Patient Care Team: Tara Pearson, MD as PCP - General (Obstetrics and Gynecology) Tara Luna, MD as Consulting Physician (General Surgery) Tara Gomez, Tara Dad, MD as Consulting Physician (Oncology) Tara Rudd, MD as Consulting Physician (Radiation Oncology) OTHER MD:  CHIEF COMPLAINT: Estrogen receptor negative breast cancer  CURRENT TREATMENT: Neoadjuvant chemotherapy; goserelin   HISTORY OF CURRENT ILLNESS: From the original intake note:  [name] palpated a mass in her right breast in early July 2019. She brought it to medical attention and underwent bilateral diagnostic mammography with tomography and right breast ultrasonography at [facility] on 06/24/2018 showing: breast density category B. There is a hypoechoic mass at the 10 o' clock right breast upper outer quadrant located 7 cm from the nipple. There were no suspicious lymph nodes in the right axilla.   Accordingly on 06/24/2018 she proceeded to biopsy of the right breast area in question. The pathology from this procedure showed ([id number]): Invasive ductal carcinoma, grade III. Prognostic indicators significant for: both estrogen receptor and progesterone receptor, 0% negative. Proliferation marker Ki67 at 80%. HER2 amplified  with ratios HER2/CEP17 signals 3.28 and average HER2 copies per cell 4.10  The patient's subsequent history is as detailed below.  INTERVAL HISTORY: Tara Gomez returns today for follow up and treatment of her estrogen receptor negative but HER2 amplified breast cancer accompanied by her sister, Tara Gomez. The patient receives Paclitaxel every 7 days and  trastuzumab/ pertuzumab every 21 days. Today is day 1 cycle 2 of paclitexel. During cycle 1, she had headaches and light headedness. She had shooting pain throughout her body, particularly in her feet,  which made it difficult to walk. She felt pins and needles in her feet, but movement made this better. She had issues with diarrhea and abdominal discomfort. On the worse day, she had 2 episodes of liquid stool. She also felt mouth sores.   She starts goserelin every 28 days, with her 1st dose today.   Since her last visit, she completed a bilateral breast MRI on 07/18/2018 showing: breast density category B. There is a 3.1 x 2.5 x 1.5 cm biopsy proven malignancy in the upper-outer right breast. Additional indeterminate non-mass enhancement extending inferiorly and posteriorly from the index lesion spanning up to 9.3 cm in the AP dimension. Indeterminate non-mass enhancement extending along the lateral left breast up to 10 cm in the AP dimension. Indeterminate 6 mm enhancing mass in the central medial LEFT breast at middle depth. Single morphologically abnormal low lying level 1 right axillary lymph node measuring up to 1.4 cm.   REVIEW OF SYSTEMS: Tara Gomez the chemotherapy is going to kill her, but she is scared that if she doesn't get chemotherapy then she risks metastatic disease. She is trying to eat vegetables and tried cutting out fast food and sodas. She drinks water, dairy-free milk, and green tea. She is still planning on working while receiving chemotherapy. Her sister is here visiting from , [place] and is a calculus and Theatre stage manager. The patient denies unusual headaches, visual changes, nausea, vomiting, or dizziness. There has been no unusual cough, phlegm production, or pleurisy. There has been no change in bowel or bladder habits. She denies unexplained fatigue or unexplained weight loss, bleeding, rash, or fever. A detailed review of systems was otherwise stable.    PAST MEDICAL HISTORY: Past Medical History:  Diagnosis Date  . Depression   .  Diabetes mellitus without complication ([place])   Dysmenorrhea   PAST SURGICAL HISTORY: Past Surgical History:  Procedure  Laterality Date  . INDUCED ABORTION    . IR IMAGING GUIDED PORT INSERTION  07/14/2018    FAMILY HISTORY Family History  Problem Relation Age of Onset  . Diabetes Mother   . Hypertension Mother   . Diabetes Paternal Grandmother   . Breast cancer Neg Hx   The patient's father died at age 27 due to liver cancer. The patient's mother is alive at age 66 as of August 2019. The patient has 1 brother and 4 sisters. The patient denies a family history of breast or ovarian cancer in the family.   GYNECOLOGIC HISTORY:  No LMP recorded. Menarche: 42 years old The patient has not carried a child to term.  She is having regular monthly periods that last 3 days and are not heavy, if she takes biotin. She used ortho tri-cyclen for birth control.     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 The patient's SO recently passed away from renal failure. Currently at home is the patient's sister visiting from [place] state and their mother who moved from [place]. The patient notes that this is a temporary arrangement.  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 The patient's mother can be reached at [phone].   HEALTH MAINTENANCE: Social History   Tobacco Use  . Smoking status: Never Smoker  . Smokeless tobacco: Never Used  Substance Use Topics  . Alcohol use: No  . Drug use: Not Currently     Colonoscopy:  PAP: December 2016  Bone density:   No Known Allergies  Current Outpatient Medications  Medication Sig Dispense Refill  . diazepam (VALIUM) 5 MG tablet Take 1 tablet (5 mg total) by mouth every 6 (six) hours as needed for anxiety. 2 tablet 0  . lidocaine-prilocaine (EMLA) cream Apply to affected area once 30 g 3  . metFORMIN (GLUCOPHAGE) 500 MG  tablet Take 1 tablet (500 mg total) by mouth 2 (two) times daily with a meal. (Patient not taking: Reported on 06/24/2018) 60 tablet 0  . prochlorperazine (COMPAZINE) 10 MG tablet Take 1 tablet (10 mg total) by mouth every 6 (six) hours as needed (Nausea or vomiting). 30 tablet 1   No current facility-administered medications for this visit.     OBJECTIVE: There were no vitals filed for this visit.   There is no height or weight on file to calculate BMI.   Wt Readings from Last 3 Encounters:  07/16/18 255 lb 11.2 oz (116 kg)  07/14/18 258 lb (117 kg)  07/11/18 258 lb 11.2 oz (117.3 kg)  ECOG FS:1 - Symptomatic but completely ambulatory GENERAL: Patient is an anxious appearing female in no acute distress HEENT:  Sclerae anicteric.  Oropharynx clear and moist. No ulcerations or evidence of oropharyngeal candidiasis. Neck is supple.  NODES:  No cervical, supraclavicular, or axillary lymphadenopathy palpated.  BREAST EXAM:  Deferred. LUNGS:  Clear to auscultation bilaterally.  No wheezes or rhonchi. HEART:  Regular rate and rhythm. No murmur appreciated. ABDOMEN:  Soft, nontender.  Positive, normoactive bowel sounds. No organomegaly palpated. MSK:  No focal spinal tenderness to palpation. Full range of motion bilaterally in the upper  extremities. EXTREMITIES:  No peripheral edema.   SKIN:  Clear with no obvious rashes or skin changes. No nail dyscrasia. NEURO:  Nonfocal. Well oriented.  Distressed affect.     LAB RESULTS:  CMP     Component Value Date/Time   NA 139 07/16/2018 1310   K 4.0 07/16/2018 1310   CL 106 07/16/2018 1310   CO2 23 07/16/2018 1310   GLUCOSE 141 (H) 07/16/2018 1310   BUN 7 07/16/2018 1310   CREATININE 0.76 07/16/2018 1310   CREATININE 0.85 07/02/2018 1218   CALCIUM 9.1 07/16/2018 1310   PROT 7.5 07/16/2018 1310   ALBUMIN 3.7 07/16/2018 1310   AST 14 (L) 07/16/2018 1310   AST 17 07/02/2018 1218   ALT 10 07/16/2018 1310   ALT 13 07/02/2018 1218   ALKPHOS  90 07/16/2018 1310   BILITOT 0.3 07/16/2018 1310   BILITOT 0.3 07/02/2018 1218   GFRNONAA >60 07/16/2018 1310   GFRNONAA >60 07/02/2018 1218   GFRAA >60 07/16/2018 1310   GFRAA >60 07/02/2018 1218    No results found for: TOTALPROTELP, ALBUMINELP, A1GS, A2GS, BETS, BETA2SER, GAMS, MSPIKE, SPEI  No results found for: KPAFRELGTCHN, LAMBDASER, KAPLAMBRATIO  Lab Results  Component Value Date   WBC 5.7 07/16/2018   NEUTROABS 3.0 07/16/2018   HGB 11.6 07/16/2018   HCT 35.7 07/16/2018   MCV 78.1 (L) 07/16/2018   PLT 343 07/16/2018    _0 @  No results found for: LABCA2  No components found for: SKAJGO115  No results for input(s): INR in the last 168 hours.  No results found for: LABCA2  No results found for: BWI203  No results found for: TDH741  No results found for: ULA453  No results found for: CA2729  No components found for: HGQUANT  No results found for: CEA1 / No results found for: CEA1   No results found for: AFPTUMOR  No results found for: CHROMOGRNA  No results found for: PSA1  No visits with results within 3 Day(s) from this visit.  Latest known visit with results is:  Appointment on 07/16/2018  Component Date Value Ref Range Status  . Sodium 07/16/2018 139  135 - 145 mmol/L Final  . Potassium 07/16/2018 4.0  3.5 - 5.1 mmol/L Final  . Chloride 07/16/2018 106  98 - 111 mmol/L Final  . CO2 07/16/2018 23  22 - 32 mmol/L Final  . Glucose, Bld 07/16/2018 141* 70 - 99 mg/dL Final  . BUN 07/16/2018 7  6 - 20 mg/dL Final  . Creatinine, Ser 07/16/2018 0.76  0.44 - 1.00 mg/dL Final  . Calcium 07/16/2018 9.1  8.9 - 10.3 mg/dL Final  . Total Protein 07/16/2018 7.5  6.5 - 8.1 g/dL Final  . Albumin 07/16/2018 3.7  3.5 - 5.0 g/dL Final  . AST 07/16/2018 14* 15 - 41 U/L Final  . ALT 07/16/2018 10  0 - 44 U/L Final  . Alkaline Phosphatase 07/16/2018 90  38 - 126 U/L Final  . Total Bilirubin 07/16/2018 0.3  0.3 - 1.2 mg/dL Final  . GFR calc non Af Amer  07/16/2018 >60  >60 mL/min Final  . GFR calc Af Amer 07/16/2018 >60  >60 mL/min Final   Comment: (NOTE) The eGFR has been calculated using the CKD EPI equation. This calculation has not been validated in all clinical situations. eGFR's persistently <60 mL/min signify possible Chronic Kidney Disease.   . Anion gap 07/16/2018 10  5 - 15 Final   Performed at [facility], [place] [address] [place], [place] [address]  . WBC 07/16/2018 5.7  3.9 - 10.3 K/uL Final  . RBC 07/16/2018 4.57  3.70 - 5.45 MIL/uL Final  . Hemoglobin 07/16/2018 11.6  11.6 - 15.9 g/dL Final  . HCT 07/16/2018 35.7  34.8 - 46.6 % Final  . MCV 07/16/2018 78.1* 79.5 - 101.0 fL Final  . MCH 07/16/2018 25.4  25.1 - 34.0 pg Final  . MCHC 07/16/2018 32.5  31.5 - 36.0 g/dL Final  . RDW 07/16/2018 14.8* 11.2 - 14.5 % Final  . Platelets 07/16/2018 343  145 - 400 K/uL Final  . Neutrophils Relative % 07/16/2018 53  % Final  . Neutro Abs 07/16/2018 3.0  1.5 - 6.5 K/uL Final  . Lymphocytes Relative 07/16/2018 35  % Final  . Lymphs Abs 07/16/2018 2.0  0.9 - 3.3 K/uL Final  . Monocytes Relative 07/16/2018 9  % Final  . Monocytes Absolute 07/16/2018 0.5  0.1 - 0.9 K/uL Final  . Eosinophils Relative 07/16/2018 2  % Final  . Eosinophils Absolute 07/16/2018 0.1  0.0 - 0.5 K/uL Final  . Basophils Relative 07/16/2018 1  % Final  . Basophils Absolute 07/16/2018 0.0  0.0 - 0.1 K/uL Final   Performed at [facility], [address] [place], [place] [address]    (this displays the last labs from the last 3 days)  No results found for: TOTALPROTELP, ALBUMINELP, A1GS, A2GS, BETS, BETA2SER, GAMS, MSPIKE, SPEI (this displays SPEP labs)  No results found for: KPAFRELGTCHN, LAMBDASER, KAPLAMBRATIO (kappa/lambda light chains)  No results found for: HGBA, HGBA2QUANT, HGBFQUANT, HGBSQUAN (Hemoglobinopathy evaluation)   No results found for: LDH  No results found for: IRON, TIBC,  IRONPCTSAT (Iron and TIBC)  No results found for: FERRITIN  Urinalysis    Component Value Date/Time   COLORURINE YELLOW 04/21/2015 [place] 04/21/2015 1039   LABSPEC 1.040 (H) 04/21/2015 1039   PHURINE 5.5 04/21/2015 1039   GLUCOSEU >1000 (A) 04/21/2015 1039   HGBUR NEGATIVE 04/21/2015 1039   BILIRUBINUR NEGATIVE 04/21/2015 1039   KETONESUR NEGATIVE 04/21/2015 1039   PROTEINUR NEGATIVE 04/21/2015 1039   UROBILINOGEN 0.2 04/21/2015 1039   NITRITE NEGATIVE 04/21/2015 [place] 04/21/2015 1039     STUDIES: Ct Chest W Contrast  Result Date: 07/15/2018 CLINICAL DATA:  Right breast cancer, staging. EXAM: CT CHEST WITH CONTRAST TECHNIQUE: Multidetector CT imaging of the chest was performed during intravenous contrast administration. CONTRAST:  108m OMNIPAQUE IOHEXOL 300 MG/ML  SOLN COMPARISON:  None. FINDINGS: Cardiovascular: Normal heart size. No significant pericardial effusion/thickening. Left internal jugular MediPort terminates at the cavoatrial junction. Great vessels are normal in course and caliber. No central pulmonary emboli. Mediastinum/Nodes: No discrete thyroid nodules. Unremarkable esophagus. Mildly enlarged 1.1 cm right axillary node (series 2/image 61). No left axillary adenopathy. No pathologically enlarged mediastinal or hilar nodes. Lungs/Pleura: No pneumothorax. No pleural effusion. No acute consolidative airspace disease, lung masses or significant pulmonary nodules. Upper abdomen: Ill-defined mild subcapsular fat is partially visualized adjacent to the porta hepatis. Musculoskeletal: No aggressive appearing focal osseous lesions. Focal soft tissue asymmetry in the outer right breast (series 2/image 78). IMPRESSION: 1. Mild right axillary adenopathy. No additional findings of metastatic disease in the chest. 2. Focal soft tissue asymmetry in the outer right breast, correlating with known right breast cancer. Electronically Signed   By: JIlona SorrelM.D.   On: 07/15/2018 15:35   Nm Bone Scan Whole Body  Result  Date: 07/15/2018 CLINICAL DATA:  Breast cancer question osseous metastatic disease EXAM: NUCLEAR MEDICINE WHOLE BODY BONE SCAN TECHNIQUE: Whole body anterior and posterior images were obtained approximately 3 hours after intravenous injection of radiopharmaceutical. RADIOPHARMACEUTICALS:  20 mCi Technetium-24mMDP IV COMPARISON:  None FINDINGS: Uptake at the shoulders, sternoclavicular joints, hips, knees and feet typically degenerative. Increased tracer localization at the distal sternum cannot exclude metastatic disease. Additional focus of uptake at the mid sternum likely represents physiologic uptake at the manubriosternal junction. No additional sites of abnormal osseous tracer localization identified to suggest metastatic disease. Uptake in the mandible question related to dental disease or dentures. Expected urinary tract and soft tissue distribution of tracer. IMPRESSION: Abnormal tracer uptake at the distal sternum question osseous metastatic disease. Electronically Signed   By: MLavonia DanaM.D.   On: 07/15/2018 17:31   Mr Breast Bilateral W WAltoonaCad  Addendum Date: 07/18/2018   ADDENDUM REPORT: 07/18/2018 16:17 ADDENDUM: Please note, the indeterminate 6 x 6 mm enhancing mass is located within the central medial LEFT breast. This was incorrectly described in the body of the report in the findings and impression section. The impression should read as follows. IMPRESSION: 1. 3.1 x 2.5 x 1.5 cm biopsy proven malignancy in the upper-outer right breast. 2. Additional indeterminate non-mass enhancement extending inferiorly and posteriorly from the index lesion spanning up to 9.3 cm in the AP dimension. 3. Indeterminate non-mass enhancement extending along the lateral left breast up to 10 cm in the AP dimension. 4. Indeterminate 6 mm enhancing mass in the central medial LEFT breast at middle depth. This may represent an  intramammary lymph node, but a clear hilar notch cannot be established on T1 weighted imaging. 5. Single morphologically abnormal low lying level 1 right axillary lymph node measuring up to 1.4 cm. The RECOMMENDATION section remains unchanged. Electronically Signed   By: SKristopher OppenheimM.D.   On: 07/18/2018 16:17   Result Date: 07/18/2018 CLINICAL DATA:  42year old female with recently diagnosed grade 3 invasive ductal carcinoma of the right breast. LABS:  BUN: 7 Creatinine: 0.76 GFR: > 60 EXAM: BILATERAL BREAST MRI WITH AND WITHOUT CONTRAST TECHNIQUE: Multiplanar, multisequence MR images of both breasts were obtained prior to and following the intravenous administration of 20 ml of MultiHance. Three-dimensional MR images were rendered by post-processing the original MR data using the DynaCAD thin client. The 3D MR images are interpreted and the findings are included in the complete MRI report below. COMPARISON:  Previous exam(s). FINDINGS: Breast composition: b. Scattered fibroglandular tissue. Background parenchymal enhancement: Mild-to-moderate. Right breast: Signal void from post biopsy tissue marker is demonstrated in the upper outer right breast at mid to posterior depth. This is in association with an irregular enhancing mass measuring approximately 3.1 x 1.5 x 2.5 cm. Indeterminate non mass enhancement is seen extending inferiorly and posteriorly from the index lesion. This spans an overall area of at least 9.3 x 4.8 x 4.2 cm (AP by craniocaudal by transverse dimensions). A component is seen extending posteriorly along the central lateral aspect of the breast as best seen on series 5 image 100 of 176. There is no definite underlying pectoralis muscle involvement. Left breast: Subtle, indeterminate non mass enhancement is also identified extending along the lateral aspect of the left breast from anterior to posterior depth. Exact measurements are difficult due to the diffuse nature, but it measures at  least 10.1 x 4.4 x 5.0 cm (AP by transverse by craniocaudal dimensions). A 6 x  6 x 5 mm enhancing mass in the central medial right breast at middle depth (series 5, image 116 of 176) may represent an intramammary lymph node. However, a clear hilar not cannot be identified on T1 weighted imaging. An additional lobulated, circumscribed mass in the central lateral left breast at posterior depth demonstrates a reniform morphology with T2 hyperintensity. This correlates with an intramammary lymph node identified mammographically. Lymph nodes: A single morphologically abnormal level 1 lymph node is identified in the low lying right axilla. It measures 1.4 x 1.2 x 1.0 cm with out evidence of a fatty hilum on T1 weighted imaging. No additional suspicious axillary or internal mammary lymphadenopathy is noted. Ancillary findings:  None. IMPRESSION: 1. 3.1 x 2.5 x 1.5 cm biopsy proven malignancy in the upper-outer right breast. 2. Additional indeterminate non-mass enhancement extending inferiorly and posteriorly from the index lesion spanning up to 9.3 cm in the AP dimension. 3. Indeterminate non-mass enhancement extending along the lateral left breast up to 10 cm in the AP dimension. 4. Indeterminate 6 mm enhancing mass in the central medial right breast at middle depth. This may represent an intramammary lymph node, but a clear hilar notch cannot be established on T1 weighted imaging. 5. Single morphologically abnormal low lying level 1 right axillary lymph node measuring up to 1.4 cm. RECOMMENDATION: 1. Three area MRI guided biopsy for bilateral non mass enhancement and an indeterminate medial left breast mass. This includes 1.) a single biopsy of the right breast at the posterior extent of non mass enhancement, 2.) biopsy of the left breast along the posterior extent of non mass enhancement and 3.) biopsy of the 6 mm enhancing mass in the central MEDIAL left breast. If the posterior aspect of non-mass enhancement  demonstrates malignancy and breast conservation therapy would be considered on the left, additional MRI guided biopsy of the anterior extent of enhancement would then be recommended. 2. Second look ultrasound for re-evaluation of the right axilla for a 1.4 cm morphologically abnormal lymph node. Additional ultrasound-guided biopsy should be performed at that time if an abnormal lymph node is identified. BI-RADS CATEGORY  4: Suspicious. Electronically Signed: By: Kristopher Oppenheim M.D. On: 07/18/2018 15:54   US Breast Ltd Uni Right Inc Axilla  Result Date: 06/24/2018 CLINICAL DATA:  RIGHT breast lump noted 3 weeks ago. EXAM: DIGITAL DIAGNOSTIC BILATERAL MAMMOGRAM WITH CAD AND TOMO ULTRASOUND RIGHT BREAST COMPARISON:  05/16/2015 ACR Breast Density Category b: There are scattered areas of fibroglandular density. FINDINGS: There is an indistinct mass in the UPPER-OUTER QUADRANT of the RIGHT breast, marked as palpable with BB. LEFT breast is negative. Mammographic images were processed with CAD. On physical exam, I palpate a discrete mass in the 10 o'clock location of the RIGHT breast 7 centimeters from the nipple. Targeted ultrasound is performed, showing irregular hypoechoic mass with hyperechoic margins and significant internal vascularity in the 10 o'clock location 7 centimeters from the RIGHT nipple. There are mixed posterior acoustic features. Evaluation of the RIGHT axilla there is multiple mildly prominent lymph nodes which are symmetric compared to the nodes in the LEFT axilla. No suspicious lymph nodes are identified sonographically. IMPRESSION: 1. Suspicious mass in the 10 o'clock location of the RIGHT breast. 2. No RIGHT axillary adenopathy. RECOMMENDATION: Ultrasound-guided core biopsy is recommended. This will be performed later today and dictated separately. I have discussed the findings and recommendations with the patient. Results were also provided in writing at the conclusion of the visit. If  applicable, a reminder letter  will be sent to the patient regarding the next appointment. BI-RADS CATEGORY  5: Highly suggestive of malignancy. Electronically Signed   By: Nolon Nations M.D.   On: 06/24/2018 14:50   Mm Diag Breast Tomo Bilateral  Result Date: 06/24/2018 CLINICAL DATA:  RIGHT breast lump noted 3 weeks ago. EXAM: DIGITAL DIAGNOSTIC BILATERAL MAMMOGRAM WITH CAD AND TOMO ULTRASOUND RIGHT BREAST COMPARISON:  05/16/2015 ACR Breast Density Category b: There are scattered areas of fibroglandular density. FINDINGS: There is an indistinct mass in the UPPER-OUTER QUADRANT of the RIGHT breast, marked as palpable with BB. LEFT breast is negative. Mammographic images were processed with CAD. On physical exam, I palpate a discrete mass in the 10 o'clock location of the RIGHT breast 7 centimeters from the nipple. Targeted ultrasound is performed, showing irregular hypoechoic mass with hyperechoic margins and significant internal vascularity in the 10 o'clock location 7 centimeters from the RIGHT nipple. There are mixed posterior acoustic features. Evaluation of the RIGHT axilla there is multiple mildly prominent lymph nodes which are symmetric compared to the nodes in the LEFT axilla. No suspicious lymph nodes are identified sonographically. IMPRESSION: 1. Suspicious mass in the 10 o'clock location of the RIGHT breast. 2. No RIGHT axillary adenopathy. RECOMMENDATION: Ultrasound-guided core biopsy is recommended. This will be performed later today and dictated separately. I have discussed the findings and recommendations with the patient. Results were also provided in writing at the conclusion of the visit. If applicable, a reminder letter will be sent to the patient regarding the next appointment. BI-RADS CATEGORY  5: Highly suggestive of malignancy. Electronically Signed   By: Nolon Nations M.D.   On: 06/24/2018 14:50   Mm Clip Placement Right  Result Date: 06/24/2018 CLINICAL DATA:  Post biopsy  mammogram of the right breast for clip placement. EXAM: DIAGNOSTIC RIGHT MAMMOGRAM POST ULTRASOUND BIOPSY COMPARISON:  Previous exam(s). FINDINGS: Mammographic images were obtained following ultrasound guided biopsy of a right breast mass at 10 o'clock. The ribbon shaped biopsy marking clip is well positioned at the site of biopsy at 10 o'clock. IMPRESSION: Appropriate positioning of the ribbon shaped biopsy marking clip in the right breast mass at 10 o'clock. Final Assessment: Post Procedure Mammograms for Marker Placement Electronically Signed   By: Ammie Ferrier M.D.   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 This was found to be concordant by Dr. Ammie Ferrier. Pathology results were discussed with the patient by telephone. The patient reported doing well after the biopsy with tenderness at the site. Post biopsy instructions and care were reviewed and questions were answered. The patient was encouraged to call [facility] for any additional concerns. The patient was referred to [facility] on July 02, 2018. Pathology results reported by Terie Purser, RN on 06/25/2018. Electronically Signed   By: Ammie Ferrier M.D.   On: 06/25/2018 12:47   Result Date: 06/25/2018 CLINICAL DATA:  42 year old female presenting for ultrasound-guided biopsy of a palpable right breast mass. EXAM: ULTRASOUND GUIDED RIGHT BREAST CORE NEEDLE BIOPSY COMPARISON:  Previous exam(s). FINDINGS: I met with the patient and we discussed the procedure of ultrasound-guided biopsy, including benefits and alternatives. We discussed the high likelihood of a successful procedure. We discussed the risks  of the procedure, including  infection, bleeding, tissue injury, clip migration, and inadequate sampling. Informed written consent was given. The usual time-out protocol was performed immediately prior to the procedure. Lesion quadrant: Upper-outer quadrant Using sterile technique and 1% Lidocaine as local anesthetic, under direct ultrasound visualization, a 14 gauge spring-loaded device was used to perform biopsy of a mass in the right breast at 10 o'clock using an inferior approach. At the conclusion of the procedure a ribbon shaped tissue marker clip was deployed into the biopsy cavity. Follow up 2 view mammogram was performed and dictated separately. IMPRESSION: Ultrasound guided biopsy of a mass in the right breast at 10 o'clock. No apparent complications. Electronically Signed: By: Ammie Ferrier M.D. On: 06/24/2018 16:21   Ir Imaging Guided Port Insertion  Result Date: 07/14/2018 CLINICAL DATA:  Breast cancer EXAM: TUNNEL POWER PORT PLACEMENT WITH SUBCUTANEOUS POCKET UTILIZING ULTRASOUND & FLOUROSCOPY FLUOROSCOPY TIME:  48 seconds.  Five mGy. MEDICATIONS AND MEDICAL HISTORY: Versed 3 mg, Fentanyl 100 mcg. Additional Medications: Ancef 2 g. Antibiotics were given within 2 hours of the procedure. ANESTHESIA/SEDATION: Moderate sedation time: 40 minutes. Nursing monitored the the patient during the procedure. PROCEDURE: After written informed consent was obtained, patient was placed in the supine position on angiographic table. The left neck and chest was prepped and draped in a sterile fashion. Lidocaine was utilized for local anesthesia. The left jugular vein was noted to be patent initially with ultrasound. Under sonographic guidance, a micropuncture needle was inserted into the left IJ vein (Ultrasound and fluoroscopic image documentation was performed). The needle was removed over an 018 wire which was exchanged for a Amplatz. This was advanced into the IVC. An 8-French dilator was advanced over the Amplatz. A small incision was  made in the left upper chest over the anterior right second rib. Utilizing blunt dissection, a subcutaneous pocket was created in the caudal direction. The pocket was irrigated with a copious amount of sterile normal saline. The port catheter was tunneled from the chest incision, and out the neck incision. The reservoir was inserted into the subcutaneous pocket and secured with two 3-0 Ethilon stitches. A peel-away sheath was advanced over the Amplatz wire. The port catheter was cut to measure length and inserted through the peel-away sheath. The peel-away sheath was removed. The chest incision was closed with 3-0 Vicryl interrupted stitches for the subcutaneous tissue and a running of 4-0 Vicryl subcuticular stitch for the skin. The neck incision was closed with a 4-0 Vicryl subcuticular stitch. Derma-bond was applied to both surgical incisions. The port reservoir was flushed and instilled with heparinized saline. No complications. FINDINGS: A left IJ vein Port-A-Cath is in place with its tip at the cavoatrial junction. COMPLICATIONS: None IMPRESSION: Successful 8 French left internal jugular vein power port placement with its tip at the SVC/RA junction. Electronically Signed   By: Marybelle Killings M.D.   On: 07/14/2018 15:37    ELIGIBLE FOR AVAILABLE RESEARCH PROTOCOL: UPBEAT  ASSESSMENT: 42 y.o. [place], [place] woman status post right breast upper outer quadrant biopsy 06/24/2018 for a clinical T2N0, stage Ib invasive ductal carcinoma, grade 3, estrogen and progesterone receptor negative, HER-2 amplified, with an MIB-1 of 80%.  (a) MRI 07/18/2018 shows in addition to the right breast mass a questionable lymph node which will require biopsy and non-masslike enhancement in the left breast which also will require biopsy.  (b) chest CT scan and bone scan obtained show increased tracer at the distal sternum, with no other sites of  concern  (1) genetics testing pending  (2) goserelin for ovarian function  preservation   (3) neoadjuvant chemotherapy to consist of carboplatin, docetaxel, trastuzumab and Pertuzumab given every 21 days x 6, delayed because of patient's concerns  (a) received paclitaxel 07/17/2018, with trastuzumab and Pertuzumab,    (4) continue trastuzumab and Pertuzumab to complete 12 months  (a) echocardiogram 07/15/2018 EF 55-60%  (5) definitive surgery to follow  (6) adjuvant radiation to follow surgery  PLAN: Spent approximately 50 minutes today with Apolonio Schneiders and her sister.  We reviewed the fact that she has a very high chance of already having disseminated disease, because her cancer is HER-2 positive, and I quoted her a 40% chance of that.  It would be higher if the lymph node we see in the right axilla by MRI is positive.  At the same time they understand that we have very good standard therapy for this type of cancer, and that in a very high percentage of patients like her to receive standard treatment with Taxol, carboplatin, trastuzumab and Pertuzumab the complete pathologic response can be as high as 40 or 50%.  She had multiple symptoms possibly related to the paclitaxel but opinion more likely related to her anxiety depression and emotional confusion.  She looks to her sister to help her make decisions.  As noted previously her sister had suggested that chemotherapy would kill her.  However today after discussing Chariti's overall situation her sister told her that in her opinion she really did need chemo if she wanted to get rid of her cancer and that she should consider the standard treatment.  We reviewed the permanent hair loss risk which is not 0 but is very low with that treatment.  After much discussion Keturah decided she could not have Taxol today because she just was not ready for it.  We are trying to get her set up for that next week.  Alternatively she could start standard therapy September 10 or September 12, with her next cycle of trastuzumab and  Pertuzumab.  I explained to Ryenn that I understand where she is coming from and I respect her concerns.  She also knows that I strongly suggest she do standard therapy as it is her best chance for long-term disease-free survival  In addition to all that she will need a biopsy of the right axillary lymph node seen on the MRI and also of some of the abnormalities seen by MRI only in the left breast.  These are being operational lysed  To discuss the finding in the distal sternum.  I think this is unlikely to be cancer but it will need to be watched in the future.  She has not yet started goserelin.  She this would affect her fertility when in fact it is intended to preserve her fertility.  She will be ready to start it possibly next week she thought but not today.  We will continue to work closely with Apolonio Schneiders and try to guide her through her treatments and other tests to the best of our ability.  Wilber Bihari, NP  07/23/18 11:11 AM Medical Oncology and Hematology [facility] [address] [place], [place] [address] Tel. [phone]    Fax. [phone]   ADDENDUM: He has been difficult to get Raisha to accept any treatment and as is she is not accepting standard therapy.  At least she does agree to paclitaxel with anti-HER-2 immunotherapy.  I am hopeful she will tolerate this well.  We can always consider intensifying treatment by adding carboplatin, or optimally switching to carboplatin docetaxel  She tells me her visiting sister is telling me if she received any chemotherapy she will die within a week or 2.  This is not helpful.  She is scheduled for breast MRI later this week and I will see her again with her second dose of paclitaxel to troubleshoot side effects.  I personally saw this patient and performed a substantive portion of this encounter with the listed APP documented above.    Adisson Deak, Tara Dad, MD  07/23/18 11:11 AM Medical Oncology and  Hematology [facility] [address] [place], [place] [address] Tel. [phone]    Fax. [phone]  Alice Rieger, am acting as scribe for Chauncey Cruel MD.  I, Lurline Del MD, have reviewed the above documentation for accuracy and completeness, and I agree with the above.

## 2018-07-24 ENCOUNTER — Ambulatory Visit: Payer: PRIVATE HEALTH INSURANCE

## 2018-07-24 ENCOUNTER — Inpatient Hospital Stay (HOSPITAL_BASED_OUTPATIENT_CLINIC_OR_DEPARTMENT_OTHER): Payer: Medicaid Other | Admitting: Oncology

## 2018-07-24 ENCOUNTER — Telehealth: Payer: Self-pay | Admitting: *Deleted

## 2018-07-24 ENCOUNTER — Other Ambulatory Visit: Payer: Self-pay | Admitting: *Deleted

## 2018-07-24 ENCOUNTER — Inpatient Hospital Stay: Payer: Medicaid Other

## 2018-07-24 ENCOUNTER — Telehealth: Payer: Self-pay | Admitting: Oncology

## 2018-07-24 ENCOUNTER — Other Ambulatory Visit: Payer: Self-pay | Admitting: Oncology

## 2018-07-24 VITALS — BP 124/71 | HR 76 | Temp 98.4°F | Resp 18 | Ht 67.0 in | Wt 261.3 lb

## 2018-07-24 DIAGNOSIS — Z171 Estrogen receptor negative status [ER-]: Secondary | ICD-10-CM

## 2018-07-24 DIAGNOSIS — Z6841 Body Mass Index (BMI) 40.0 and over, adult: Secondary | ICD-10-CM

## 2018-07-24 DIAGNOSIS — F418 Other specified anxiety disorders: Secondary | ICD-10-CM | POA: Diagnosis not present

## 2018-07-24 DIAGNOSIS — C50411 Malignant neoplasm of upper-outer quadrant of right female breast: Secondary | ICD-10-CM | POA: Diagnosis not present

## 2018-07-24 DIAGNOSIS — E119 Type 2 diabetes mellitus without complications: Secondary | ICD-10-CM

## 2018-07-24 DIAGNOSIS — Z5112 Encounter for antineoplastic immunotherapy: Secondary | ICD-10-CM | POA: Diagnosis not present

## 2018-07-24 DIAGNOSIS — N6320 Unspecified lump in the left breast, unspecified quadrant: Secondary | ICD-10-CM

## 2018-07-24 DIAGNOSIS — Z7189 Other specified counseling: Secondary | ICD-10-CM

## 2018-07-24 DIAGNOSIS — R590 Localized enlarged lymph nodes: Secondary | ICD-10-CM

## 2018-07-24 DIAGNOSIS — R937 Abnormal findings on diagnostic imaging of other parts of musculoskeletal system: Secondary | ICD-10-CM

## 2018-07-24 DIAGNOSIS — R928 Other abnormal and inconclusive findings on diagnostic imaging of breast: Secondary | ICD-10-CM

## 2018-07-24 LAB — COMPREHENSIVE METABOLIC PANEL
ALT: 18 U/L (ref 0–44)
AST: 16 U/L (ref 15–41)
Albumin: 3.5 g/dL (ref 3.5–5.0)
Alkaline Phosphatase: 86 U/L (ref 38–126)
Anion gap: 10 (ref 5–15)
BUN: 7 mg/dL (ref 6–20)
CO2: 23 mmol/L (ref 22–32)
Calcium: 8.5 mg/dL — ABNORMAL LOW (ref 8.9–10.3)
Chloride: 107 mmol/L (ref 98–111)
Creatinine, Ser: 0.76 mg/dL (ref 0.44–1.00)
GFR calc Af Amer: >60 mL/min (ref >60)
GFR calc non Af Amer: >60 mL/min (ref >60)
Glucose, Bld: 205 mg/dL — ABNORMAL HIGH (ref 70–99)
Potassium: 3.7 mmol/L (ref 3.5–5.1)
Sodium: 140 mmol/L (ref 135–145)
Total Bilirubin: <0.2 mg/dL — ABNORMAL LOW (ref 0.3–1.2)
Total Protein: 6.9 g/dL (ref 6.5–8.1)

## 2018-07-24 LAB — CBC WITH DIFFERENTIAL/PLATELET
Basophils Absolute: 0 10*3/uL (ref 0.0–0.1)
Basophils Relative: 1 %
Eosinophils Absolute: 0.2 10*3/uL (ref 0.0–0.5)
Eosinophils Relative: 4 %
HCT: 31.8 % — ABNORMAL LOW (ref 34.8–46.6)
Hemoglobin: 10.3 g/dL — ABNORMAL LOW (ref 11.6–15.9)
Lymphocytes Relative: 43 %
Lymphs Abs: 2.3 10*3/uL (ref 0.9–3.3)
MCH: 25.7 pg (ref 25.1–34.0)
MCHC: 32.4 g/dL (ref 31.5–36.0)
MCV: 79.3 fL — ABNORMAL LOW (ref 79.5–101.0)
Monocytes Absolute: 0.4 10*3/uL (ref 0.1–0.9)
Monocytes Relative: 7 %
Neutro Abs: 2.5 10*3/uL (ref 1.5–6.5)
Neutrophils Relative %: 45 %
Platelets: 362 10*3/uL (ref 145–400)
RBC: 4.01 MIL/uL (ref 3.70–5.45)
RDW: 14.9 % — ABNORMAL HIGH (ref 11.2–14.5)
WBC: 5.5 10*3/uL (ref 3.9–10.3)

## 2018-07-24 NOTE — Telephone Encounter (Signed)
Spoke with patient to inform of the need for the additional biopsies for the left breast.  She is scheduled for these 9/6 at 7am and also scheduled for her 2nd look u/s and possible bx of right axilla on 9/3 at 7:10.  She is aware of these appointments.

## 2018-07-24 NOTE — Telephone Encounter (Signed)
Per 8/29 los I put a request in the book for 9/5

## 2018-07-25 ENCOUNTER — Other Ambulatory Visit: Payer: Self-pay

## 2018-07-25 ENCOUNTER — Other Ambulatory Visit: Payer: Self-pay | Admitting: Adult Health

## 2018-07-25 ENCOUNTER — Telehealth: Payer: Self-pay | Admitting: *Deleted

## 2018-07-25 DIAGNOSIS — E119 Type 2 diabetes mellitus without complications: Secondary | ICD-10-CM

## 2018-07-25 DIAGNOSIS — C50411 Malignant neoplasm of upper-outer quadrant of right female breast: Secondary | ICD-10-CM

## 2018-07-25 DIAGNOSIS — Z171 Estrogen receptor negative status [ER-]: Principal | ICD-10-CM

## 2018-07-25 DIAGNOSIS — Z6841 Body Mass Index (BMI) 40.0 and over, adult: Secondary | ICD-10-CM

## 2018-07-25 NOTE — Telephone Encounter (Signed)
Called patient after noticing what looked like she underwent a lumpectomy and SLN  at [facility] on 07/22/18.  She did not relay this information to Dr. Jana Hakim or anyone else when she was here 8/29.  Informed her she did not need to under go right axilla bx on 9/3 but still needs to have the biopsies on the left side on 9/6.  I'm not even sure [facility] knew she had chemo a week before.  She states she felt as the tumor was getting bigger and wanted to do something about it.  Informed her I would discuss with Dr.Magrinat on Tuesday and get back with her.

## 2018-07-29 ENCOUNTER — Telehealth: Payer: Self-pay | Admitting: *Deleted

## 2018-07-29 ENCOUNTER — Inpatient Hospital Stay: Admission: RE | Admit: 2018-07-29 | Payer: Self-pay | Source: Ambulatory Visit

## 2018-07-29 ENCOUNTER — Other Ambulatory Visit: Payer: Self-pay | Admitting: Oncology

## 2018-07-29 ENCOUNTER — Other Ambulatory Visit: Payer: Self-pay

## 2018-07-29 NOTE — Telephone Encounter (Signed)
Called and spoke with patient concerning her appointments here and plan.  I discussed the situation with Dr. Jana Hakim.  She has an appointment with Dr. Dollene Cleveland (medical oncologist) in [place] who is part of [facility] on 9/12.  Dr. Jana Hakim recommends that the patient receive all her care at one facility to ensure that appropriate communication is being relayed between providers as she is not communicating the care she has received thus far with her surgeon and medical oncologist. She was instructed to call us back after her appointment with Dr. Dollene Cleveland to let us know how she wants to proceed.  She was given my direct contact information.  Patient verbalized understanding.   Encouraged her to keep her appointment for the biopsies of the left breast for 9/6.  I also informed her that I have cancelled her appointments for chemo for now due to her having surgery 8/27. Explained the reasoning for this which she had trouble understanding.     I also informed the nurse new pt. Coordinator at [facility] of the current situation as they were not aware that she had received chemo a week before surgery.

## 2018-07-30 ENCOUNTER — Other Ambulatory Visit: Payer: PRIVATE HEALTH INSURANCE

## 2018-07-30 ENCOUNTER — Other Ambulatory Visit: Payer: Self-pay | Admitting: Oncology

## 2018-07-30 ENCOUNTER — Encounter: Payer: PRIVATE HEALTH INSURANCE | Admitting: Genetic Counselor

## 2018-07-30 NOTE — Progress Notes (Signed)
DISCONTINUE OFF PATHWAY REGIMEN - Breast   [id number]:Paclitaxel + Trastuzumab:   A cycle is every 28 days:     Paclitaxel      Trastuzumab-xxxx      Trastuzumab-xxxx   **Always confirm dose/schedule in your pharmacy ordering system**  REASON: Other Reason PRIOR TREATMENT: Off Pathway: Paclitaxel + Trastuzumab TREATMENT RESPONSE: Unable to Evaluate  START ON PATHWAY REGIMEN - Breast     A cycle is every 21 days:     Pertuzumab      Pertuzumab      Trastuzumab-xxxx      Trastuzumab-xxxx      Carboplatin      Docetaxel   **Always confirm dose/schedule in your pharmacy ordering system**  Patient Characteristics: Preoperative or Nonsurgical Candidate (Clinical Staging), Neoadjuvant Therapy followed by Surgery, Invasive Disease, Chemotherapy, HER2 Positive, ER Negative/Unknown Therapeutic Status: Preoperative or Nonsurgical Candidate (Clinical Staging) AJCC M Category: cM0 AJCC Grade: G3 Breast Surgical Plan: Neoadjuvant Therapy followed by Surgery ER Status: Negative (-) AJCC 8 Stage Grouping: IIA HER2 Status: Positive (+) AJCC T Category: cT2 AJCC N Category: cN0 PR Status: Negative (-) Intent of Therapy: Curative Intent, Discussed with Patient

## 2018-07-31 ENCOUNTER — Ambulatory Visit: Payer: Self-pay

## 2018-08-01 ENCOUNTER — Inpatient Hospital Stay: Admission: RE | Admit: 2018-08-01 | Payer: Self-pay | Source: Ambulatory Visit

## 2018-08-01 ENCOUNTER — Other Ambulatory Visit: Payer: Self-pay

## 2018-08-01 ENCOUNTER — Encounter (HOSPITAL_COMMUNITY): Payer: Self-pay | Admitting: *Deleted

## 2018-08-01 ENCOUNTER — Telehealth: Payer: Self-pay | Admitting: *Deleted

## 2018-08-01 NOTE — Progress Notes (Signed)
[facility] Intake Session  The patient presented to session with a depressed, anxious mood and matching affect. The patient expressed that she feels concerned about her mortality and is struggling with existential, spiritual questions. She also shared that she has been feeling depressed since her cancer diagnosis in July. The patient shared that she saw a psychologist in 2008 and received a diagnosis of Bipolar Disorder. The patient reported that she has a previous history of suicidal ideation starting at age 37, but she reported that she has never made any suicidal attempts. The patient also reported that she was hospitalized at age 36 for suicidal ideation after her father died from liver cancer. The patient reported that the last time she experienced suicidal ideation was at age 42. Upon assessment, the patient denied any current suicidal or homicidal ideation. The patient expressed that her coping skills include meditating, going to the park, and her spirituality. The patient was forthcoming and engaged throughout session. The patient asked for the counselor's advice related to her spiritual questions, and the counselor validated the patient's desire for answers, while also explaining that the role of a counselor does not involve imposing beliefs and values. The counselor offered to provide a supportive space for the patient to develop personal insight into her own spiritual beliefs and provided empathy, reflections, and therapeutic presence. In future sessions, the counselor and patient will further explore the patient's spiritual concerns and engage in the meaning making process.

## 2018-08-01 NOTE — Telephone Encounter (Signed)
Left message for a return phone call.

## 2018-08-05 ENCOUNTER — Telehealth: Payer: Self-pay | Admitting: *Deleted

## 2018-08-05 ENCOUNTER — Ambulatory Visit: Payer: PRIVATE HEALTH INSURANCE

## 2018-08-05 NOTE — Telephone Encounter (Signed)
I have left a couple of messages for patient to return my phone call concerning her treatment here.

## 2018-08-06 NOTE — Progress Notes (Signed)
[facility] Counseling Session  The patient presented to session with a depressed mood and matching affect. The patient expressed feeling "confused" about the role of God in her cancer diagnosis. The patient reported that she has been feeling "downhearted" and "depressed" in the last week due to feeling uncertain about what the future will hold and as if God is not present with her. Upon assessment, the patient denied any current suicidal ideation. The counselor and patient focused on the patient feeling a loss of hope, dreams, and control.The counselor and patient also explored the patient's supports and sources of meaning making, including her boyfriend, her spirituality, meditation, and listening to jazz and classical music. In future sessions, the counselor and patient will continue in the meaning making process.

## 2018-08-07 ENCOUNTER — Other Ambulatory Visit: Payer: PRIVATE HEALTH INSURANCE

## 2018-08-07 ENCOUNTER — Ambulatory Visit: Payer: Self-pay

## 2018-08-11 ENCOUNTER — Encounter: Payer: Self-pay | Admitting: *Deleted

## 2018-08-12 ENCOUNTER — Encounter: Payer: Self-pay | Admitting: *Deleted

## 2018-08-12 NOTE — Progress Notes (Signed)
Patient showed up in the lobby yesterday 08/12/18 with some paperwork for insurance/claim to be filled out by Dr. Jana Hakim.  This RN filled out necessary paperwork concerning her diagnosis and when diagnosed.  Informed her she would also need to get the new information from her doctors at [facility] as well.  I tried to confirm with her that she will be receiving treatment at [facility] (per note from Dr. Dollene Cleveland she had decided to get treated at the main campus in [place]). She was complaining about having to drive to [facility] and wanted to know if she could talk to Dr. Jana Hakim more about the taxotere.  Informed her I would discuss with Dr. Jana Hakim upon his return to the office but [place] was [place] at this time and encouraged her to continue with treatment plans with Dr. Dollene Cleveland in the meantime.    Spent about 45 minutes with her discussing the same issues and repeating the same information multiple times.

## 2018-08-20 NOTE — Progress Notes (Signed)
[facility] Counseling Session  The patient presented to session with a depressed mood.  The patient expressed that she has been feeling disappointed with the lack of support she has felt from some of the important relationships in her life. The patient shared that she is feeling hurt and overwhelmed by losses of important relationships, control, and identity. The patient reported that she fears being alone. The patient expressed feeling like she is in "a maze" and unsure of what steps to take next in her life.  The counselor provided validation and therapeutic presence. The counselor and patient explored the meaning of the patient's experiences and the small steps that she would like to take in her life. The patient identified a desire to begin journaling and eating healthy foods. The counselor offered a referral to the Breast Cancer Support Group, but the patient expressed that she is not interested at this time. The counselor and patient will schedule a follow-up after the patient begins chemo next week.  Doris Cheadle, Counseling Intern [phone]

## 2018-08-26 ENCOUNTER — Telehealth: Payer: Self-pay | Admitting: *Deleted

## 2018-08-26 NOTE — Telephone Encounter (Signed)
Left message for a return phone call to see if patient wanted to come in to discuss treatment with Dr. Jana Hakim. Per Dr. Jana Hakim he could see her 10/11 at 8:30am.  Information left on patient's voicemail with instructions to call me back if she still wants this appointment.

## 2018-08-26 NOTE — Progress Notes (Signed)
[facility] Phone Check-in  The counselor called the patient to check in about how she is doing with chemo starting tomorrow.  The patient expressed that "I am trying to keep my hopes up." The patient shared that she fears the changes that may occur in her body, as well as her relationships and job situation as she begins treatment. The patient expressed that her "heart is in pain." The patient denied any current suicidal ideation.   The counselor provided validation and empathetic reflections in response to the patient. The patient and counselor scheduled an appointment for October 4 at 2 pm, and the patient was invited to reach out to the counselor if she needs any support before her next appointment.  Doris Cheadle, Counseling Intern [phone]

## 2018-08-26 NOTE — Telephone Encounter (Signed)
Received call back from patient stating she would like to take the appointment for 10/11 at 830am.  Instructed her to continue her care for now with [facility] as she has a treatment this week.  She verbalized understanding.

## 2018-08-27 ENCOUNTER — Telehealth: Payer: Self-pay | Admitting: *Deleted

## 2018-08-27 NOTE — Telephone Encounter (Signed)
Medical records faxed to [place]; release [phone]

## 2018-08-28 ENCOUNTER — Ambulatory Visit: Payer: PRIVATE HEALTH INSURANCE

## 2018-08-28 ENCOUNTER — Other Ambulatory Visit: Payer: PRIVATE HEALTH INSURANCE

## 2018-09-02 ENCOUNTER — Ambulatory Visit: Payer: PRIVATE HEALTH INSURANCE

## 2018-09-03 NOTE — Progress Notes (Signed)
[name]  [phone] [phone]     ID: Tara Gomez DOB: September 07, 1976  MR#: [id number]  YJE#:[id number]  Patient Care Team: Tara Pearson, MD as PCP - General (Obstetrics and Gynecology) Tara Luna, MD as Consulting Physician (General Surgery) Tara Gomez, Virgie Dad, MD as Consulting Physician (Oncology) Tara Rudd, MD as Consulting Physician (Radiation Oncology) Tara Mins, MD as Referring Physician (Surgical Oncology) OTHER MD:  CHIEF COMPLAINT: Estrogen receptor negative breast cancer  CURRENT TREATMENT: Neoadjuvant chemotherapy; goserelin   HISTORY OF CURRENT ILLNESS: From the original intake note:  Tara Gomez palpated a mass in her right breast in early July 2019. She brought it to medical attention and underwent bilateral diagnostic mammography with tomography and right breast ultrasonography at [facility] on 06/24/2018 showing: breast density category B. There is a hypoechoic mass at the 10 o' clock right breast upper outer quadrant located 7 cm from the nipple. There were no suspicious lymph nodes in the right axilla.   Accordingly on 06/24/2018 she proceeded to biopsy of the right breast area in question. The pathology from this procedure showed ([id number]): Invasive ductal carcinoma, grade III. Prognostic indicators significant for: both estrogen receptor and progesterone receptor, 0% negative. Proliferation marker Ki67 at 80%. HER2 amplified  with ratios HER2/CEP17 signals 3.28 and average HER2 copies per cell 4.10  The patient's subsequent history is as detailed below.  INTERVAL HISTORY: Tara Gomez returns today for follow up of her estrogen receptor negative but HER2 amplified breast cancer. She received 1 cycle of chemotherapy with paclitaxel, trastuzumab, and pertuzumab on 07/17/2018 here at [facility].   She consulted Dr. Clovis Riley with [facility].  She did not disclose that she had had prior chemotherapy just a week  before that visit.  She had right lumpectomy with sentinel lymph node sampling on 07/22/2018 with pathology showing ([id number]): Invasive ductal carcinoma, grade 3 spanning 3.3 cm. Margins were negative, but close with the lateral and posterior margin being less than 1 mm from the invasive carcinoma. Out of 3 right axillary sentinel lymph nodes biopsied, one was positive for metastatic carcinoma. Tara Gomez notes that she tolerated her surgery well, although she feels some shooting pains in both her left and right breast. She feels that the right breast has some skin buckling.   She was also following up with Dr. Dollene Cleveland at [facility] for a second opinion and Dr. Derrill Center for a third opinion in chemotherapy treatment options, both suggesting that Tara Gomez receive 6 cycles of TCHP. Tara Gomez is currently receiving treatments with Dr. Dollene Cleveland. She receives weekly paclitaxel with cycle 2 on 08/27/2018 and cycle 3 on 09/03/2018. She will also continue trastuzumab and pertuzumab for one year. She is tolerating this generally well. She notes some pain in her big toe. She denies numbness or tingling in her toes, hands or fingers. She also has trace blood in her stool.   REVIEW OF SYSTEMS: Tara Gomez has some questions about her lymph node involvement. She has been reading about doxorubicin and what patient's get this chemotherapy. She is wondering which types of chemotherapies are more effective and which can decreased the size. She notes that she has cut out some of the sugars and sweet foods in her diets. She is concerned about cutting out all meats about eating a raw foods diet. She feels that she has a high level stress and worrying because she is not fully understanding her cancer diagnosis and the risk of metastatic disease. She is also wondering why some patients receive  one chemotherapy drug and other patient receive two drugs at one time.  She notes that she stopped taking biotin due to her cancer diagnosis, but it helped  her with painful periods. She is currently not working right now, but she plans to return before radiation treatments. She denies unusual headaches, visual changes, nausea, vomiting, or dizziness. There has been no unusual cough, phlegm production, or pleurisy. There has been no change in bowel or bladder habits. She denies unexplained fatigue or unexplained weight loss, bleeding, rash, or fever. A detailed review of systems was otherwise stable.     PAST MEDICAL HISTORY: Past Medical History:  Diagnosis Date  . Depression   . Diabetes mellitus without complication ([place])   Dysmenorrhea   PAST SURGICAL HISTORY: Past Surgical History:  Procedure Laterality Date  . INDUCED ABORTION    . IR IMAGING GUIDED PORT INSERTION  07/14/2018    FAMILY HISTORY Family History  Problem Relation Age of Onset  . Diabetes Mother   . Hypertension Mother   . Diabetes Paternal Grandmother   . Breast cancer Neg Hx   The patient's father died at age 22 due to liver cancer. The patient's mother is alive at age 82 as of August 2019. The patient has 1 brother and 4 sisters. The patient denies a family history of breast or ovarian cancer in the family.   GYNECOLOGIC HISTORY:  No LMP recorded. Menarche: 42 years old The patient has not carried a child to term.  She is having regular monthly periods that last 3 days and are not heavy, if she takes biotin. She used ortho tri-cyclen for birth control.     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 The patient's SO recently passed away from renal failure. Currently at home is the patient's sister visiting from [place] state and their mother who moved from [place]. The patient notes that this is a temporary arrangement.  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 The patient's mother can be reached at [phone].   HEALTH MAINTENANCE: Social History   Tobacco Use  . Smoking status: Never Smoker  . Smokeless tobacco: Never Used  Substance Use Topics  . Alcohol use: No  . Drug use: Not Currently     Colonoscopy:  PAP: December 2016  Bone density:   No Known Allergies  Current Outpatient Medications  Medication Sig Dispense Refill  . diazepam (VALIUM) 5 MG tablet Take 1 tablet (5 mg total) by mouth every 6 (six) hours as needed for anxiety. 2 tablet 0  . metFORMIN (GLUCOPHAGE) 500 MG tablet Take 1 tablet (500 mg total) by mouth 2 (two) times daily with a meal. (Patient not taking: Reported on 06/24/2018) 60 tablet 0   No current facility-administered medications for this visit.     OBJECTIVE: Young woman who appears stated age 17:   09/05/18 0836  BP: 114/61  Pulse: 75  Resp: 18  Temp: 98.9 F (37.2 C)  SpO2: 99%     Body mass index is 39.83 kg/m.   Wt Readings from Last 3 Encounters:  09/05/18 254 lb 4.8 oz (115.3 kg)  07/24/18 261 lb 4.8 oz (118.5 kg)  07/16/18 255 lb 11.2 oz (116 kg)  ECOG FS:1 - Symptomatic  but completely ambulatory  Sclerae unicteric, EOMs intact Oropharynx clear and moist No cervical or supraclavicular adenopathy Lungs no rales or rhonchi Heart regular rate and rhythm Abd soft, obese, nontender, positive bowel sounds MSK no focal spinal tenderness, no upper extremity lymphedema Neuro: nonfocal, well oriented, anxious and depressed affect Breasts:      LAB RESULTS:  CMP     Component Value Date/Time   NA 140 07/24/2018 0930   K 3.7 07/24/2018 0930   CL 107 07/24/2018 0930   CO2 23 07/24/2018 0930   GLUCOSE 205 (H) 07/24/2018 0930   BUN 7 07/24/2018 0930   CREATININE 0.76 07/24/2018 0930   CREATININE 0.85 07/02/2018 1218   CALCIUM 8.5 (L) 07/24/2018 0930   PROT 6.9 07/24/2018 0930   ALBUMIN 3.5 07/24/2018 0930   AST 16 07/24/2018 0930    AST 17 07/02/2018 1218   ALT 18 07/24/2018 0930   ALT 13 07/02/2018 1218   ALKPHOS 86 07/24/2018 0930   BILITOT <0.2 (L) 07/24/2018 0930   BILITOT 0.3 07/02/2018 1218   GFRNONAA >60 07/24/2018 0930   GFRNONAA >60 07/02/2018 1218   GFRAA >60 07/24/2018 0930   GFRAA >60 07/02/2018 1218    No results found for: TOTALPROTELP, ALBUMINELP, A1GS, A2GS, BETS, BETA2SER, GAMS, MSPIKE, SPEI  No results found for: KPAFRELGTCHN, LAMBDASER, KAPLAMBRATIO  Lab Results  Component Value Date   WBC 5.5 07/24/2018   NEUTROABS 2.5 07/24/2018   HGB 10.3 (L) 07/24/2018   HCT 31.8 (L) 07/24/2018   MCV 79.3 (L) 07/24/2018   PLT 362 07/24/2018    _0 @  No results found for: LABCA2  No components found for: [id number]  No results for input(s): INR in the last 168 hours.  No results found for: LABCA2  No results found for: HAL937  No results found for: TKW409  No results found for: BDZ329  No results found for: CA2729  No components found for: HGQUANT  No results found for: CEA1 / No results found for: CEA1   No results found for: AFPTUMOR  No results found for: CHROMOGRNA  No results found for: PSA1  No visits with results within 3 Day(s) from this visit.  Latest known visit with results is:  Appointment on 07/24/2018  Component Date Value Ref Range Status  . WBC 07/24/2018 5.5  3.9 - 10.3 K/uL Final  . RBC 07/24/2018 4.01  3.70 - 5.45 MIL/uL Final  . Hemoglobin 07/24/2018 10.3* 11.6 - 15.9 g/dL Final  . HCT 07/24/2018 31.8* 34.8 - 46.6 % Final  . MCV 07/24/2018 79.3* 79.5 - 101.0 fL Final  . MCH 07/24/2018 25.7  25.1 - 34.0 pg Final  . MCHC 07/24/2018 32.4  31.5 - 36.0 g/dL Final  . RDW 07/24/2018 14.9* 11.2 - 14.5 % Final  . Platelets 07/24/2018 362  145 - 400 K/uL Final  . Neutrophils Relative % 07/24/2018 45  % Final  . Neutro Abs 07/24/2018 2.5  1.5 - 6.5 K/uL Final  . Lymphocytes Relative 07/24/2018 43  % Final  . Lymphs Abs 07/24/2018 2.3  0.9 - 3.3 K/uL  Final  . Monocytes Relative 07/24/2018 7  % Final  . Monocytes Absolute 07/24/2018 0.4  0.1 - 0.9 K/uL Final  . Eosinophils Relative 07/24/2018 4  % Final  . Eosinophils Absolute 07/24/2018 0.2  0.0 - 0.5 K/uL Final  . Basophils Relative 07/24/2018 1  % Final  . Basophils Absolute 07/24/2018 0.0  0.0 - 0.1 K/uL Final   Performed at [facility], [place] [address], [place], [place] [address]  . Sodium 07/24/2018 140  135 - 145 mmol/L Final  . Potassium 07/24/2018 3.7  3.5 - 5.1 mmol/L Final  . Chloride 07/24/2018 107  98 - 111 mmol/L Final  . CO2 07/24/2018 23  22 - 32 mmol/L Final  . Glucose, Bld 07/24/2018 205* 70 - 99 mg/dL Final  . BUN 07/24/2018 7  6 - 20 mg/dL Final  . Creatinine, Ser 07/24/2018 0.76  0.44 - 1.00 mg/dL Final  . Calcium 07/24/2018 8.5* 8.9 - 10.3 mg/dL Final  . Total Protein 07/24/2018 6.9  6.5 - 8.1 g/dL Final  . Albumin 07/24/2018 3.5  3.5 - 5.0 g/dL Final  . AST 07/24/2018 16  15 - 41 U/L Final  . ALT 07/24/2018 18  0 - 44 U/L Final  . Alkaline Phosphatase 07/24/2018 86  38 - 126 U/L Final  . Total Bilirubin 07/24/2018 <0.2* 0.3 - 1.2 mg/dL Final  . GFR calc non Af Amer 07/24/2018 >60  >60 mL/min Final  . GFR calc Af Amer 07/24/2018 >60  >60 mL/min Final   Comment: (NOTE) The eGFR has been calculated using the CKD EPI equation. This calculation has not been validated in all clinical situations. eGFR's persistently <60 mL/min signify possible Chronic Kidney Disease.   Georgiann Hahn gap 07/24/2018 10  5 - 15 Final   Performed at [facility], [address] [place], [place] [address]    (this displays the last labs from the last 3 days)  No results found for: TOTALPROTELP, ALBUMINELP, A1GS, A2GS, BETS, BETA2SER, GAMS, MSPIKE, SPEI (this displays SPEP labs)  No results found for: KPAFRELGTCHN, LAMBDASER, KAPLAMBRATIO (kappa/lambda light chains)  No results found for: HGBA, HGBA2QUANT, HGBFQUANT,  HGBSQUAN (Hemoglobinopathy evaluation)   No results found for: LDH  No results found for: IRON, TIBC, IRONPCTSAT (Iron and TIBC)  No results found for: FERRITIN  Urinalysis    Component Value Date/Time   COLORURINE YELLOW 04/21/2015 [place] 04/21/2015 1039   LABSPEC 1.040 (H) 04/21/2015 1039   PHURINE 5.5 04/21/2015 1039   GLUCOSEU >1000 (A) 04/21/2015 [place] 04/21/2015 [place] 04/21/2015 1039   KETONESUR NEGATIVE 04/21/2015 1039   PROTEINUR NEGATIVE 04/21/2015 1039   UROBILINOGEN 0.2 04/21/2015 1039   NITRITE NEGATIVE 04/21/2015 [place] 04/21/2015 1039     STUDIES: No results found.  ELIGIBLE FOR AVAILABLE RESEARCH PROTOCOL: UPBEAT  ASSESSMENT: 42 y.o. [place], [place] woman status post right breast upper outer quadrant biopsy 06/24/2018 for a clinical T2N0, stage Ib invasive ductal carcinoma, grade 3, estrogen and progesterone receptor negative, HER-2 amplified, with an MIB-1 of 80%.  (a) MRI 07/18/2018 shows in addition to the right breast mass a questionable lymph node which will require biopsy and non-masslike enhancement in the left breast which also will require biopsy.  (b) chest CT scan and bone scan obtained show increased tracer at the distal sternum, with no other sites of concern  (1) genetics testing pending  (2) goserelin for ovarian function preservation   (3) neoadjuvant chemotherapy to consist of carboplatin, docetaxel, trastuzumab and Pertuzumab given every 21 days x 6 recommended: patient opted against   (a) received paclitaxel 07/17/2018, with trastuzumab and Pertuzumab  (b) scheduled for goserelin 07/21/2018 and 08/05/2018 but did not show   (4) underwent right lumpectomy and sentinel lymph node sampling under Dr Clovis Riley 07/22/2018 for a pT2 pN1, stage IIB invasive ductal carcinoma,  grade 3, with negative margins.  1 of 3 sentinel lymph nodes positive  (5) being treated under  Dr. Dollene Cleveland at [facility], with paclitaxel, trastuzumab and Pertuzumab, continuing the anti-HER-2 antibodies to complete 12 months  (a) echocardiogram 07/15/2018 EF 55-60%  (6) adjuvant radiation to follow surgery  PLAN: I met with Kaytelyn today for approximately 1hour.  She had many questions.  She wanted to know the size of her tumor when it was first seen radiologically and they did grow between then and the time that it was removed surgically.  She wanted to know why lymph nodes were not noted radiologically prior to her surgery.  She wanted to know if her biomarkers were tested.  She would like to know why she would not be receiving red double chemotherapy.  All her questions included why some patients are treated with more than one chemotherapy agent, why chemotherapy does not kill all the cancer cells, whether or omitting all sugar from her diet would be adequate as a diet change,, how do cells avoid being killed by immunotherapy, why we did not check her for biotin receptors, and can she have TDM 1 instead of the current anti-HER-2 treatments.  I went over these questions with Farrie and answered them to the best of my ability.  Note that this patient has also been seen by 2 different physicians at [facility] and also by a physician at [facility] and that some of these questions have been addressed in the past.  I reviewed her films here and explained that some studies are more sensitive than others.  It does not necessarily mean that her cancer grew between her mammogram, her MRI, and her surgery, although certainly it could have grown a little bit in that period of time.  We discussed the fact that cancers are not entirely homogeneous and each cancer has many different types of cells in it some of which are going to be sensitive to treatment and some of which may not be.  That explains why treatment is not always effective and why recurrence may occur.  We also discussed issues regarding diet and  exercise.  We explained why we do not check biotin receptors and why we do not obtain biomarkers except in stage IV cases.  She understands we prefer to not use doxorubicin in cases where we also used anti-HER-2 treatment.   After having gone through the questions I admonished Rande to be completely open with her physicians and particularly Dr. Dollene Cleveland whom she trusts.  I reminded her that keeping information from Dr. Clovis Riley had put her at significant risk, since she had surgery so soon after chemotherapy and could have been neutropenic.  She wanted to know if she was getting the best treatment possible and I explained that all her physicians have told her that the standard of care in her case is trastuzumab, Pertuzumab, carboplatin and docetaxel and that she is not getting the best treatment at this time only because she has chosen not to receive it.  Her doctors have been very clear as to what was recommended.  I explained that Dr. Dollene Cleveland has studied for 10 years to be able to give her advice and even though Adisson is very intelligent and very interested in learning about her cancer and is trying to do the best research she can, my advice is she do exactly what Dr. Dollene Cleveland suggests if she wishes to get the best possible results in her own case.  I have  not made a return appointment for Nastasha here but I will be glad to see her again at any point in the future if I can be helpful in her care    Magrinat, Virgie Dad, MD  09/05/18 9:45 AM Medical Oncology and Hematology [facility], [place] [address] Tel. [phone]    Fax. [phone]  Alice Rieger, am acting as scribe for Chauncey Cruel MD.  I, Lurline Del MD, have reviewed the above documentation for accuracy and completeness, and I agree with the above.

## 2018-09-05 ENCOUNTER — Inpatient Hospital Stay: Payer: Medicaid Other | Attending: Oncology | Admitting: Oncology

## 2018-09-05 VITALS — BP 114/61 | HR 75 | Temp 98.9°F | Resp 18 | Ht 67.0 in | Wt 254.3 lb

## 2018-09-05 DIAGNOSIS — Z7984 Long term (current) use of oral hypoglycemic drugs: Secondary | ICD-10-CM | POA: Insufficient documentation

## 2018-09-05 DIAGNOSIS — Z923 Personal history of irradiation: Secondary | ICD-10-CM

## 2018-09-05 DIAGNOSIS — Z79899 Other long term (current) drug therapy: Secondary | ICD-10-CM

## 2018-09-05 DIAGNOSIS — C50411 Malignant neoplasm of upper-outer quadrant of right female breast: Secondary | ICD-10-CM | POA: Diagnosis present

## 2018-09-05 DIAGNOSIS — Z171 Estrogen receptor negative status [ER-]: Secondary | ICD-10-CM

## 2018-09-05 DIAGNOSIS — F329 Major depressive disorder, single episode, unspecified: Secondary | ICD-10-CM | POA: Insufficient documentation

## 2018-09-05 DIAGNOSIS — E119 Type 2 diabetes mellitus without complications: Secondary | ICD-10-CM

## 2018-09-05 DIAGNOSIS — Z9221 Personal history of antineoplastic chemotherapy: Secondary | ICD-10-CM | POA: Diagnosis not present

## 2018-09-07 ENCOUNTER — Other Ambulatory Visit: Payer: Self-pay | Admitting: Oncology

## 2018-09-07 ENCOUNTER — Encounter: Payer: Self-pay | Admitting: Oncology

## 2018-09-08 ENCOUNTER — Telehealth: Payer: Self-pay | Admitting: Oncology

## 2018-09-08 NOTE — Telephone Encounter (Signed)
Per 10/11 no los

## 2018-09-18 ENCOUNTER — Other Ambulatory Visit: Payer: PRIVATE HEALTH INSURANCE

## 2018-09-18 ENCOUNTER — Ambulatory Visit: Payer: PRIVATE HEALTH INSURANCE

## 2018-09-30 ENCOUNTER — Ambulatory Visit: Payer: PRIVATE HEALTH INSURANCE

## 2018-10-09 ENCOUNTER — Ambulatory Visit: Payer: PRIVATE HEALTH INSURANCE

## 2018-10-09 ENCOUNTER — Other Ambulatory Visit: Payer: PRIVATE HEALTH INSURANCE

## 2018-10-16 ENCOUNTER — Ambulatory Visit (HOSPITAL_COMMUNITY): Payer: PRIVATE HEALTH INSURANCE | Admitting: Internal Medicine

## 2018-10-16 ENCOUNTER — Ambulatory Visit (HOSPITAL_COMMUNITY): Admission: RE | Admit: 2018-10-16 | Payer: Medicaid Other | Source: Ambulatory Visit

## 2018-10-28 ENCOUNTER — Ambulatory Visit: Payer: PRIVATE HEALTH INSURANCE

## 2018-10-30 ENCOUNTER — Ambulatory Visit: Payer: PRIVATE HEALTH INSURANCE

## 2018-10-30 ENCOUNTER — Other Ambulatory Visit: Payer: PRIVATE HEALTH INSURANCE

## 2018-10-30 ENCOUNTER — Encounter: Payer: Self-pay | Admitting: *Deleted

## 2018-11-02 IMAGING — US US BREAST BX W LOC DEV 1ST LESION IMG BX SPEC US GUIDE*R*
1 series · 9 of 9 positions shown · non-contrast
Comparison: Previous exam(s).

ADDENDUM:
Pathology revealed GRADE III INVASIVE DUCTAL CARCINOMA of the Right
breast, 10 o'clock. This was found to be concordant by Dr. Matthias
Qilun.

Pathology results were discussed with the patient by telephone. The
patient reported doing well after the biopsy with tenderness at the
site. Post biopsy instructions and care were reviewed and questions
were answered. The patient was encouraged to call The [REDACTED]
The patient was referred to [REDACTED]
[REDACTED] at [REDACTED] on
July 02, 2018.
Pathology results reported by Nguyet Kimes, RN on 06/25/2018.
CLINICAL DATA: 42-year-old female presenting for ultrasound-guided
biopsy of a palpable right breast mass.
EXAM:
ULTRASOUND GUIDED RIGHT BREAST CORE NEEDLE BIOPSY

[Series 1: us breast bx w loc dev 1st lesion img bx spec us g · 0.07mm/px · 9 of 9 slices shown]
[im 1/9]
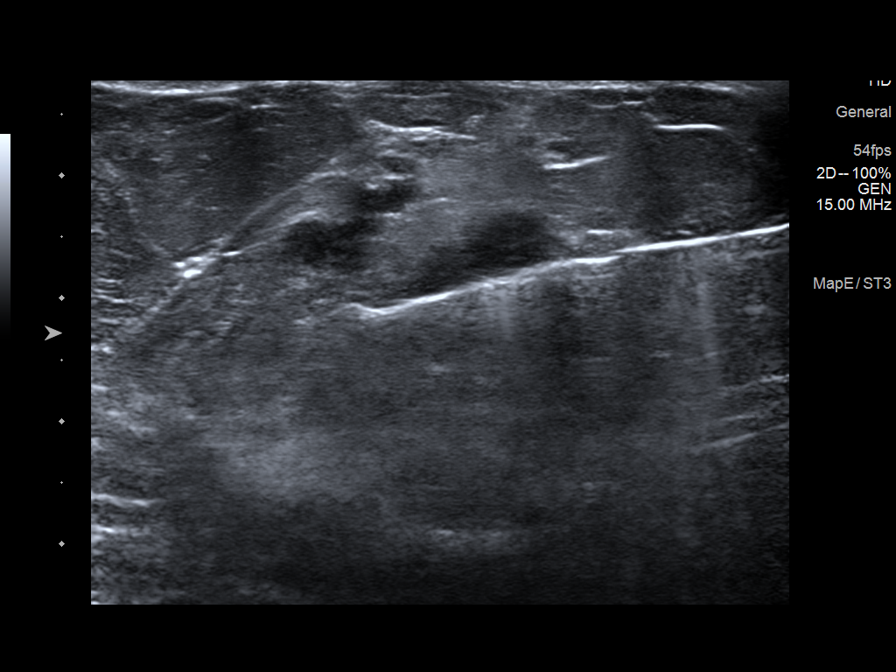
[im 2/9]
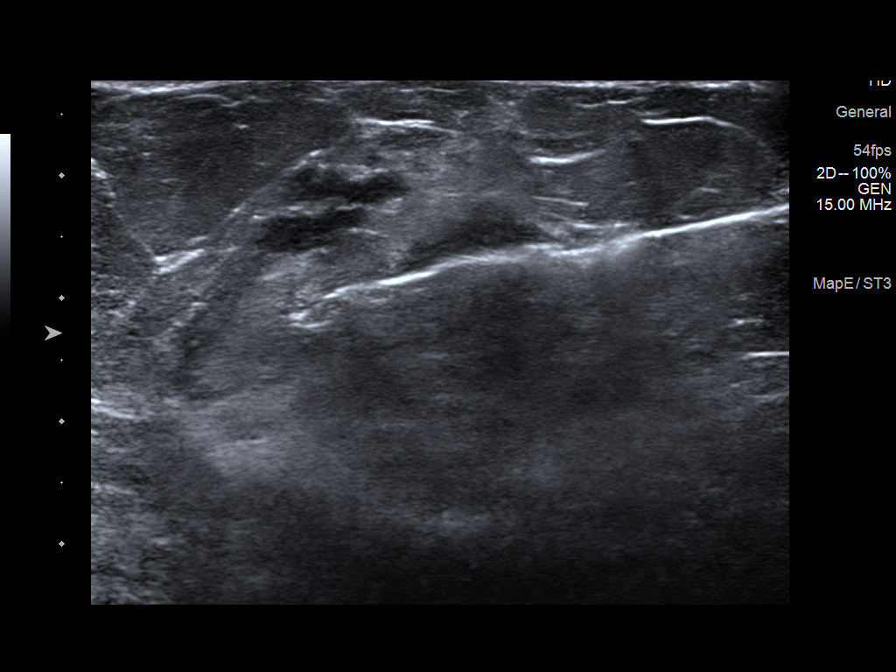
[im 3/9]
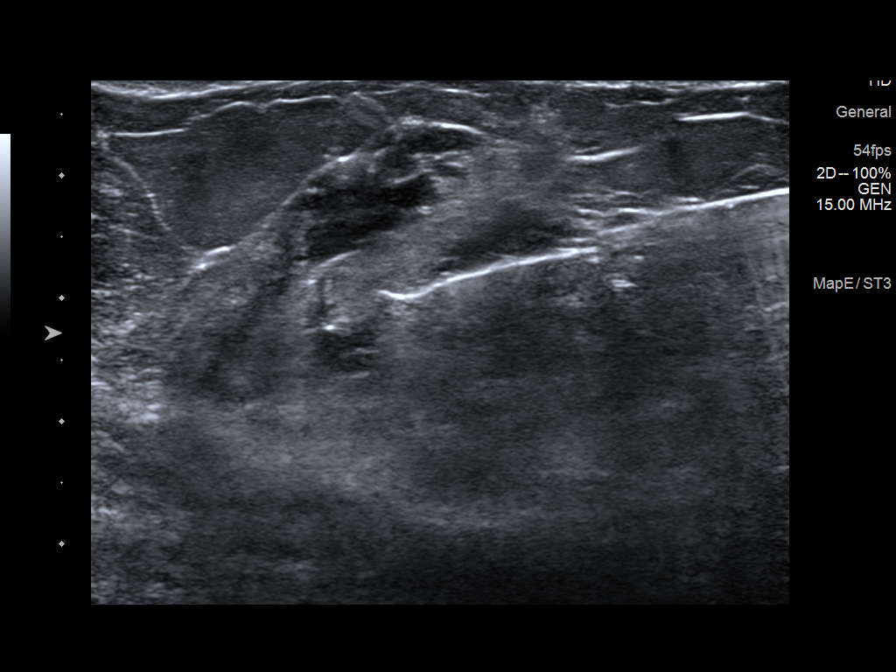
[im 4/9]
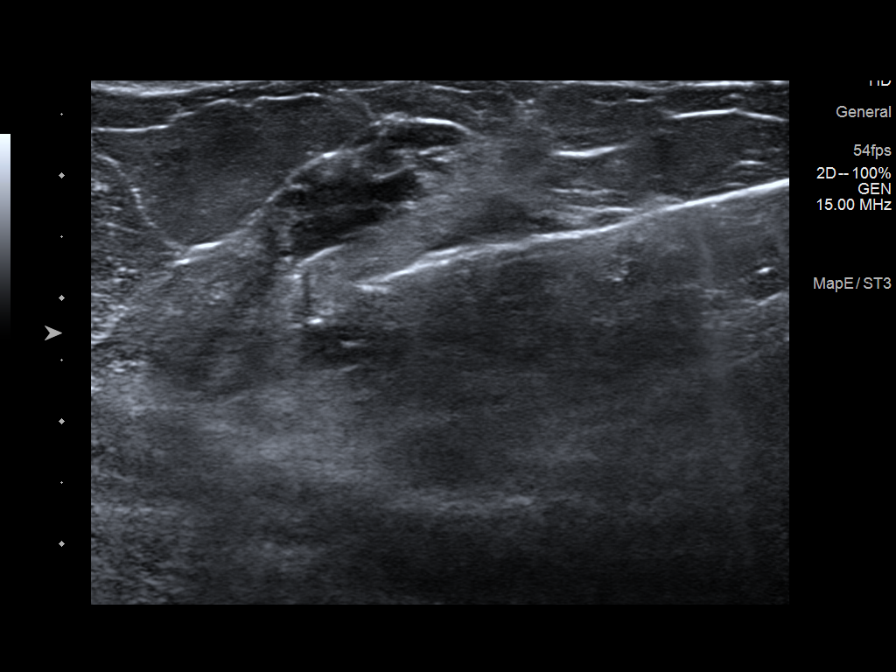
[im 5/9]
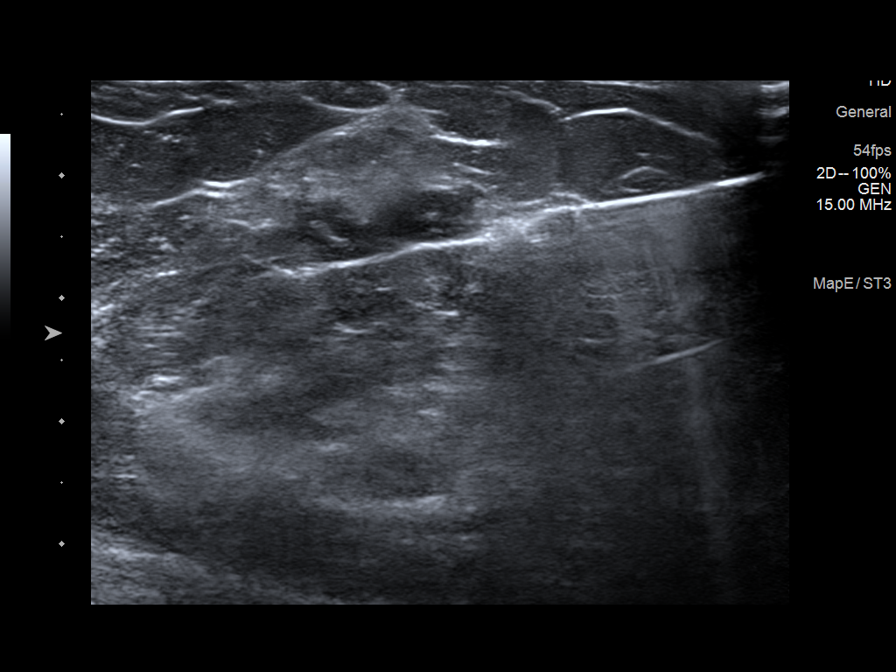
[im 6/9]
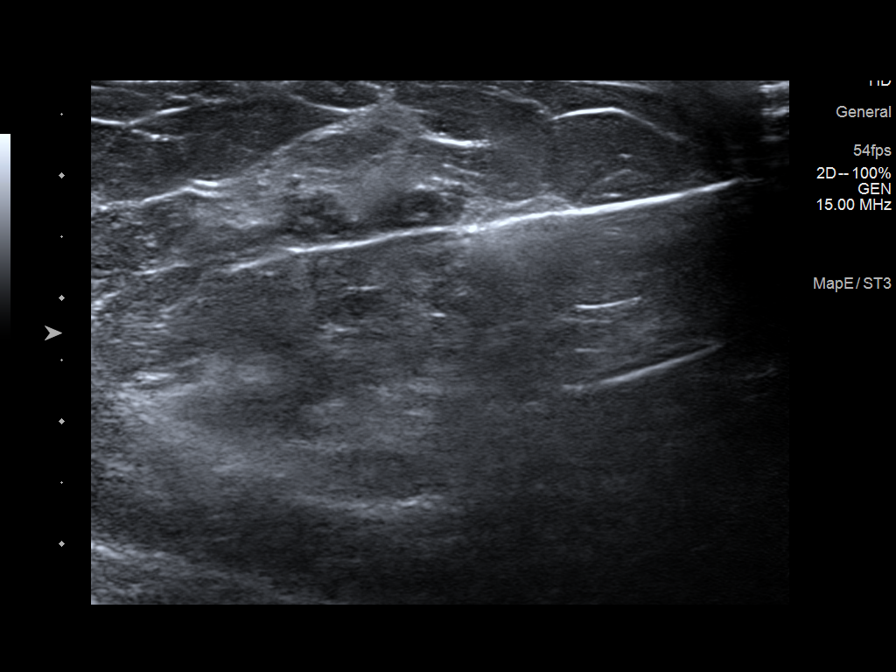
[im 7/9]
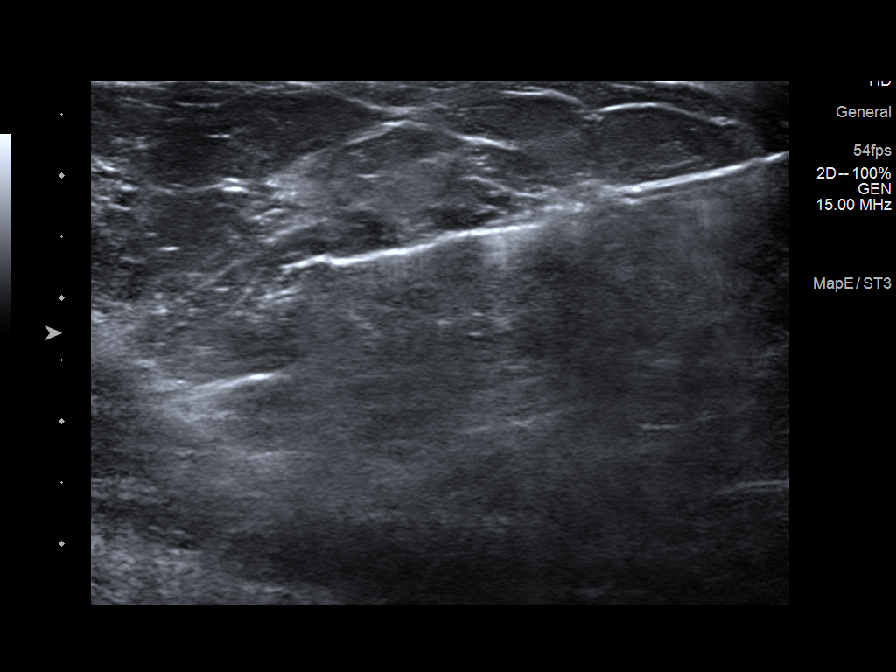
[im 8/9]
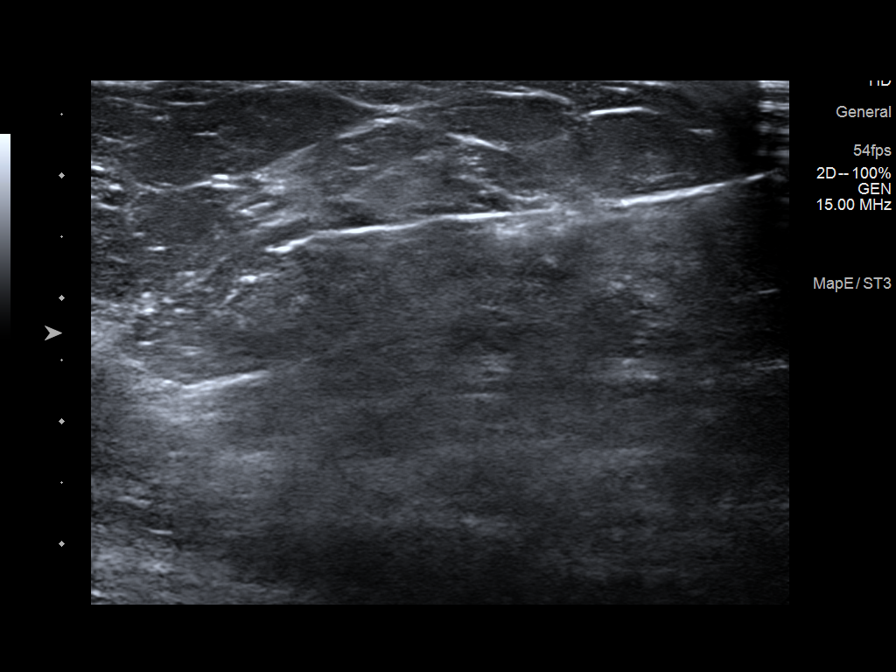
[im 9/9]
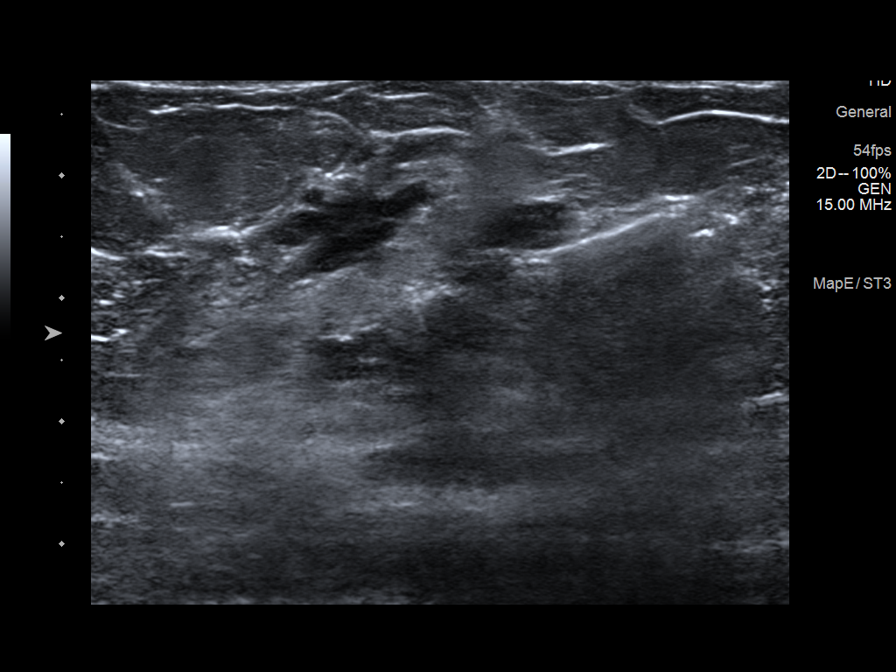

[9 of 9 positions shown; findings below may reference images not displayed]



Lesion quadrant: Upper-outer quadrant

Using sterile technique and 1% Lidocaine as local anesthetic, under
direct ultrasound visualization, a 14 gauge Maria Victoria device was
used to perform biopsy of a mass in the right breast at 10 o'clock
using an inferior approach. At the conclusion of the procedure a
ribbon shaped tissue marker clip was deployed into the biopsy
cavity. Follow up 2 view mammogram was performed and dictated
separately.
IMPRESSION: Ultrasound guided biopsy of a mass in the right breast at 10
o'clock. No apparent complications.

## 2018-11-20 ENCOUNTER — Ambulatory Visit: Payer: PRIVATE HEALTH INSURANCE

## 2018-11-25 ENCOUNTER — Ambulatory Visit: Payer: PRIVATE HEALTH INSURANCE

## 2019-11-05 ENCOUNTER — Encounter (HOSPITAL_COMMUNITY): Payer: Self-pay

## 2020-01-31 DIAGNOSIS — Z23 Encounter for immunization: Secondary | ICD-10-CM | POA: Diagnosis not present

## 2020-02-20 DIAGNOSIS — Z23 Encounter for immunization: Secondary | ICD-10-CM | POA: Diagnosis not present

## 2020-07-15 DIAGNOSIS — C50911 Malignant neoplasm of unspecified site of right female breast: Secondary | ICD-10-CM | POA: Diagnosis not present

## 2020-07-20 ENCOUNTER — Telehealth (HOSPITAL_COMMUNITY): Payer: Self-pay

## 2020-07-20 ENCOUNTER — Other Ambulatory Visit (HOSPITAL_COMMUNITY): Payer: Self-pay | Admitting: Oncology

## 2020-07-20 DIAGNOSIS — C50919 Malignant neoplasm of unspecified site of unspecified female breast: Secondary | ICD-10-CM

## 2020-07-20 NOTE — Telephone Encounter (Signed)
Called to schedule port removal, no answer, left vm. AW 

## 2020-07-22 ENCOUNTER — Telehealth (HOSPITAL_COMMUNITY): Payer: Self-pay | Admitting: Radiology

## 2020-07-22 NOTE — Telephone Encounter (Signed)
Called pt to schedule port removal, left VM for her to call back. JM

## 2020-08-02 ENCOUNTER — Telehealth (HOSPITAL_COMMUNITY): Payer: Self-pay

## 2020-08-02 NOTE — Telephone Encounter (Signed)
Called to schedule port removal, no answer, left vm. AW 

## 2020-08-10 ENCOUNTER — Ambulatory Visit (HOSPITAL_COMMUNITY): Admission: RE | Admit: 2020-08-10 | Payer: Medicaid Other | Source: Ambulatory Visit

## 2020-08-29 ENCOUNTER — Encounter (HOSPITAL_COMMUNITY): Payer: Self-pay

## 2020-08-29 ENCOUNTER — Ambulatory Visit (HOSPITAL_COMMUNITY): Payer: Medicaid Other

## 2020-09-21 DIAGNOSIS — R1909 Other intra-abdominal and pelvic swelling, mass and lump: Secondary | ICD-10-CM | POA: Diagnosis not present

## 2020-09-28 DIAGNOSIS — C50919 Malignant neoplasm of unspecified site of unspecified female breast: Secondary | ICD-10-CM | POA: Diagnosis not present

## 2020-09-28 DIAGNOSIS — C50911 Malignant neoplasm of unspecified site of right female breast: Secondary | ICD-10-CM | POA: Diagnosis not present

## 2020-09-28 DIAGNOSIS — Z170 Estrogen receptor positive status [ER+]: Secondary | ICD-10-CM | POA: Diagnosis not present

## 2021-02-22 ENCOUNTER — Other Ambulatory Visit: Payer: Self-pay

## 2021-02-22 DIAGNOSIS — N6310 Unspecified lump in the right breast, unspecified quadrant: Secondary | ICD-10-CM

## 2021-02-22 DIAGNOSIS — C50411 Malignant neoplasm of upper-outer quadrant of right female breast: Secondary | ICD-10-CM

## 2021-03-07 ENCOUNTER — Other Ambulatory Visit: Payer: Self-pay

## 2021-03-07 ENCOUNTER — Ambulatory Visit: Payer: Self-pay | Admitting: *Deleted

## 2021-03-07 VITALS — BP 112/72 | Wt 249.1 lb

## 2021-03-07 DIAGNOSIS — Z1239 Encounter for other screening for malignant neoplasm of breast: Secondary | ICD-10-CM

## 2021-03-07 DIAGNOSIS — N644 Mastodynia: Secondary | ICD-10-CM

## 2021-03-07 DIAGNOSIS — N6311 Unspecified lump in the right breast, upper outer quadrant: Secondary | ICD-10-CM

## 2021-03-07 NOTE — Progress Notes (Signed)
Tara Gomez is a 45 y.o. female who presents to [facility] clinic today with complaint of right outer breast pain and possible lump x 2-3 weeks. Patient stated the pain comes and goes. Patient rates the pain at a 7 out of 10.    Pap Smear: Pap smear not completed today. Last Pap smear was in 2017 at [facility] and normal per patient. Per patient has no history of an abnormal Pap smear. Last Pap smear result is not available in Epic.   Physical exam: Breasts Left breast slightly larger than right breast that has increased since lumpectomy in 2019. No skin abnormalities left breast. Two scars observed right axilla and right upper outer breast from history of lumpectomy in 2019. Right nipple swollen appearing on exam. No nipple retraction bilateral breasts. No nipple discharge bilateral breasts. No lymphadenopathy. No lumps palpated left breast. Palpated a lump within the right breast at 10 o'clock 7 cm from the nipple. Complaints of right diffuse breast pain that is greater within the outer breast.   Pelvic/Bimanual Patient refused Pap smear today.   Smoking History: Patient has never smoked.   Patient Navigation: Patient education provided. Access to services provided for patient through [facility] program.   Breast and Cervical Cancer Risk Assessment: Patient does not have family history of breast cancer. Patient has a personal history of breast cancer. Patient has no known genetic mutations or radiation treatment to the chest before age 72. Patient does not have history of cervical dysplasia, immunocompromised, or DES exposure in-utero.  Risk Assessment    Risk Scores      03/07/2021 06/24/2018   Last edited by: Loletta Parish, RN Armond Hang, LPN   5-year risk: 1.5 % 0.6 %   Lifetime risk: 17.8 % 9.6 %          A: [facility] exam without pap smear Complaint of right breast pain and possible lump.  P: Referred patient to the [facility] for a  diagnostic mammogram. Appointment scheduled Thursday, March 09, 2021 at [place].  Loletta Parish, RN 03/07/2021 8:24 AM

## 2021-03-07 NOTE — Patient Instructions (Signed)
Explained breast self awareness with Tara Gomez. Patient refused Pap smear today. Let her know [facility]CP will cover Pap smears every 3 years unless has a history of abnormal Pap smears and to call when she would like to schedule. Explained the importance of having a Pap smear completed. Referred patient to the [facility] for a diagnostic mammogram. Appointment scheduled Thursday, March 09, 2021 at [place]. Patient aware of appointment and will be there. Tara Gomez verbalized understanding.  [name], [name], RN 8:24 AM

## 2021-03-09 ENCOUNTER — Other Ambulatory Visit: Payer: Self-pay

## 2021-03-09 ENCOUNTER — Ambulatory Visit
Admission: RE | Admit: 2021-03-09 | Discharge: 2021-03-09 | Disposition: A | Discharge status: Home / Self Care | Payer: No Typology Code available for payment source | Source: Ambulatory Visit | Attending: Obstetrics and Gynecology | Admitting: Obstetrics and Gynecology

## 2021-03-09 ENCOUNTER — Ambulatory Visit
Admission: RE | Admit: 2021-03-09 | Discharge: 2021-03-09 | Disposition: A | Discharge status: Home / Self Care | Payer: Medicaid Other | Source: Ambulatory Visit | Attending: Obstetrics and Gynecology | Admitting: Obstetrics and Gynecology

## 2021-03-09 DIAGNOSIS — Z171 Estrogen receptor negative status [ER-]: Secondary | ICD-10-CM

## 2021-03-09 DIAGNOSIS — N6310 Unspecified lump in the right breast, unspecified quadrant: Secondary | ICD-10-CM

## 2021-03-09 DIAGNOSIS — C50411 Malignant neoplasm of upper-outer quadrant of right female breast: Secondary | ICD-10-CM

## 2021-07-18 IMAGING — MG DIGITAL DIAGNOSTIC BILAT W/ TOMO W/ CAD
8 of 17 series · 8 of 40 positions shown · non-contrast
Comparison: Previous exam(s).

CLINICAL DATA: 44-year-old female presenting for annual exam.
History of right breast cancer in 9976 status post lumpectomy and
radiation. Patient reports chronic pain throughout the outer aspect
of the left breast and a possible lump in the outer right breast.

EXAM:
DIGITAL DIAGNOSTIC BILATERAL MAMMOGRAM WITH TOMOSYNTHESIS AND CAD;
ULTRASOUND RIGHT BREAST LIMITED
TECHNIQUE: Bilateral digital diagnostic mammography and breast tomosynthesis
was performed. The images were evaluated with computer-aided
detection.; Targeted ultrasound examination of the right breast was
performed

[R CC (1 of 2)]
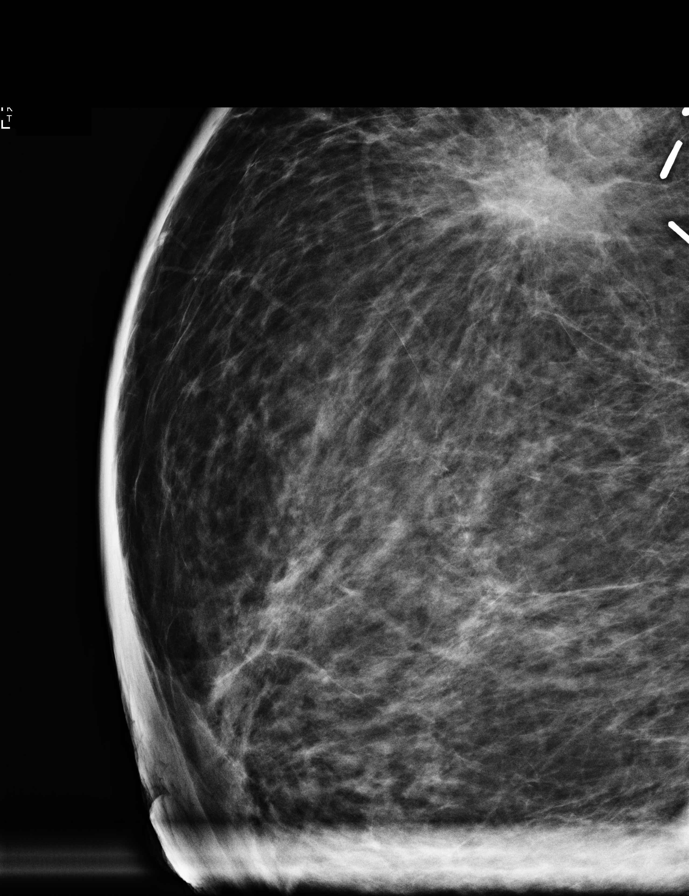

[R CC (2 of 2)]
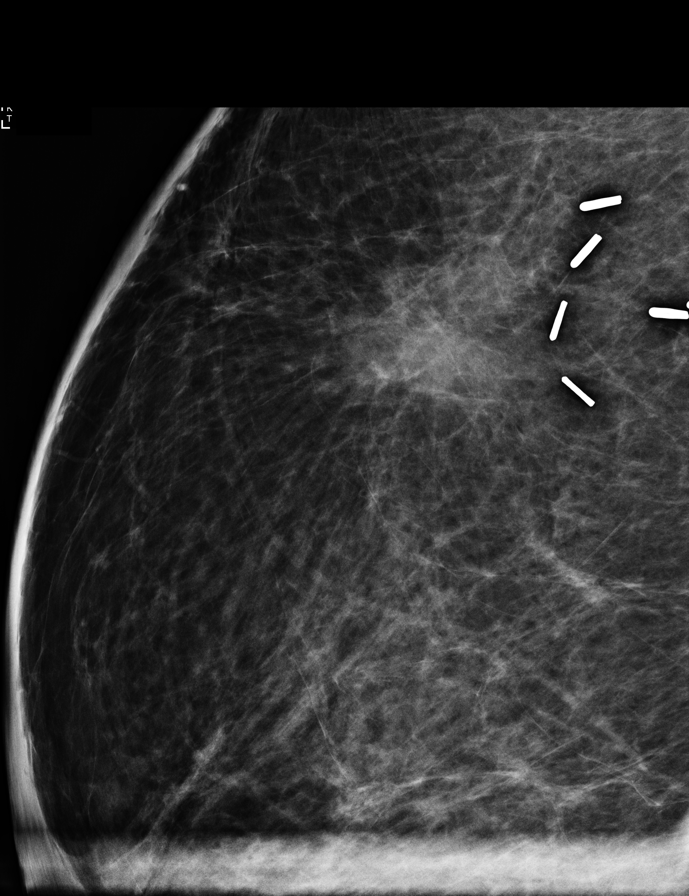

[R CC synth-2D]
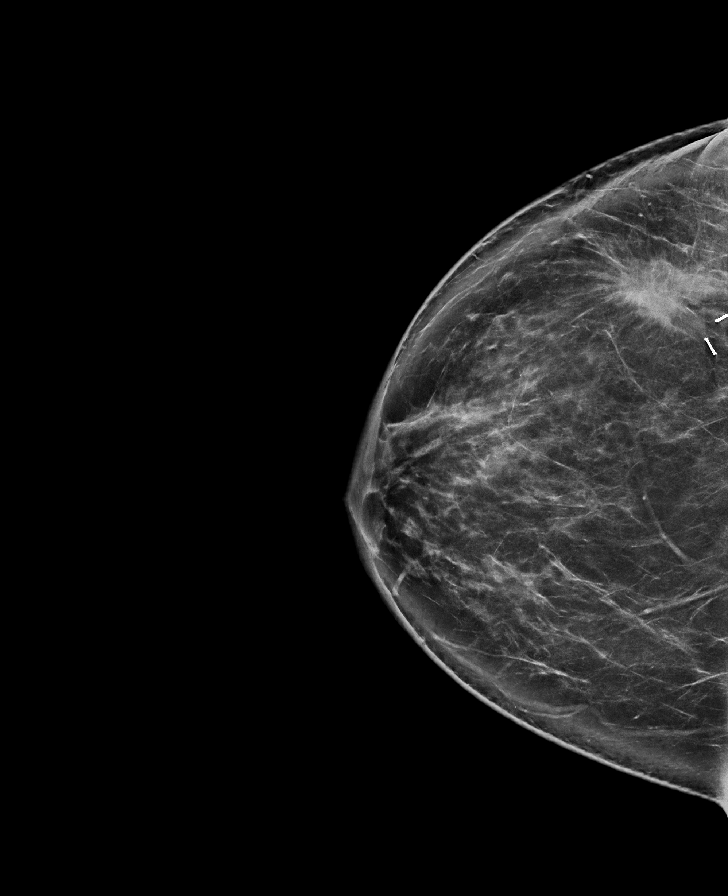

[R XCCL synth-2D]
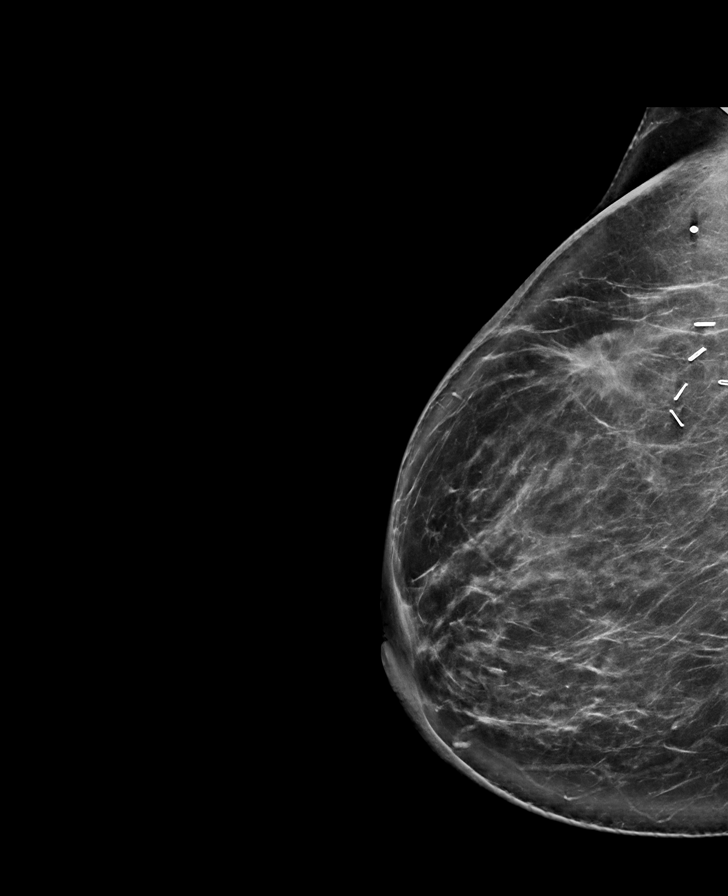

[R MLO synth-2D (1 of 2)]
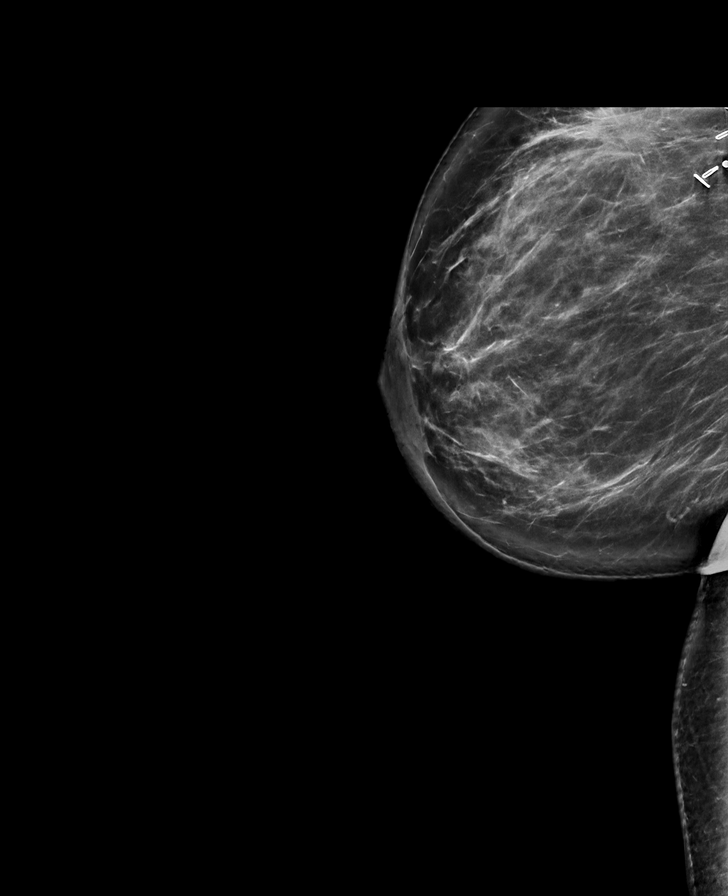

[L MLO synth-2D]
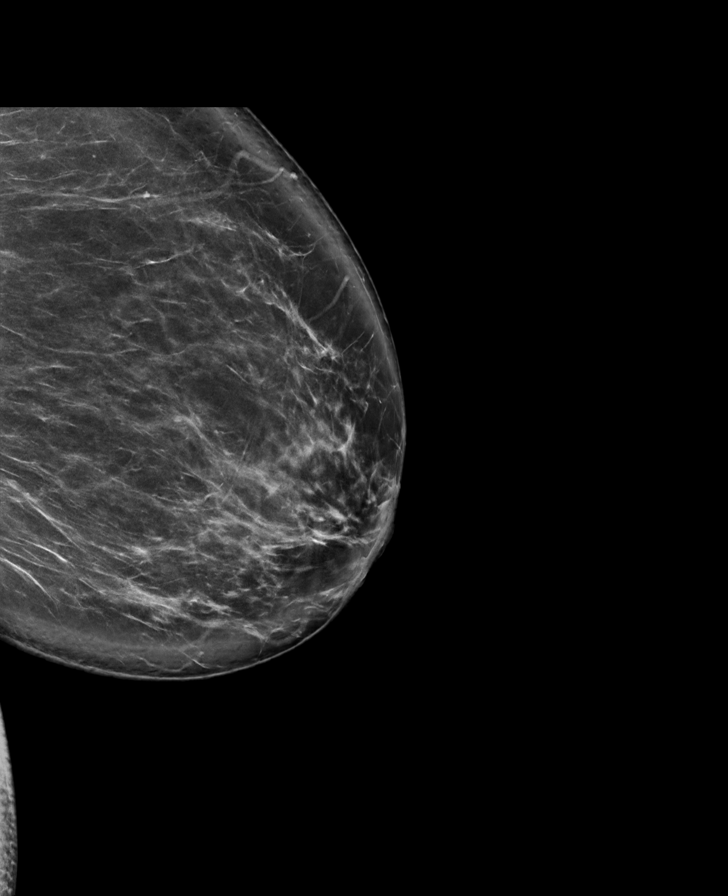

[L CC synth-2D]
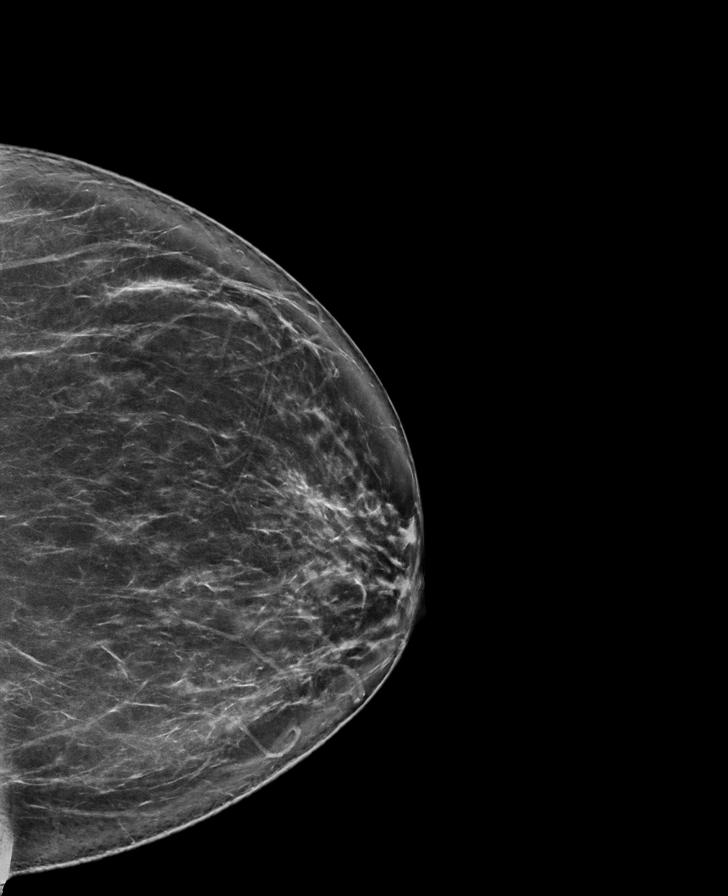

[R MLO synth-2D (2 of 2)]
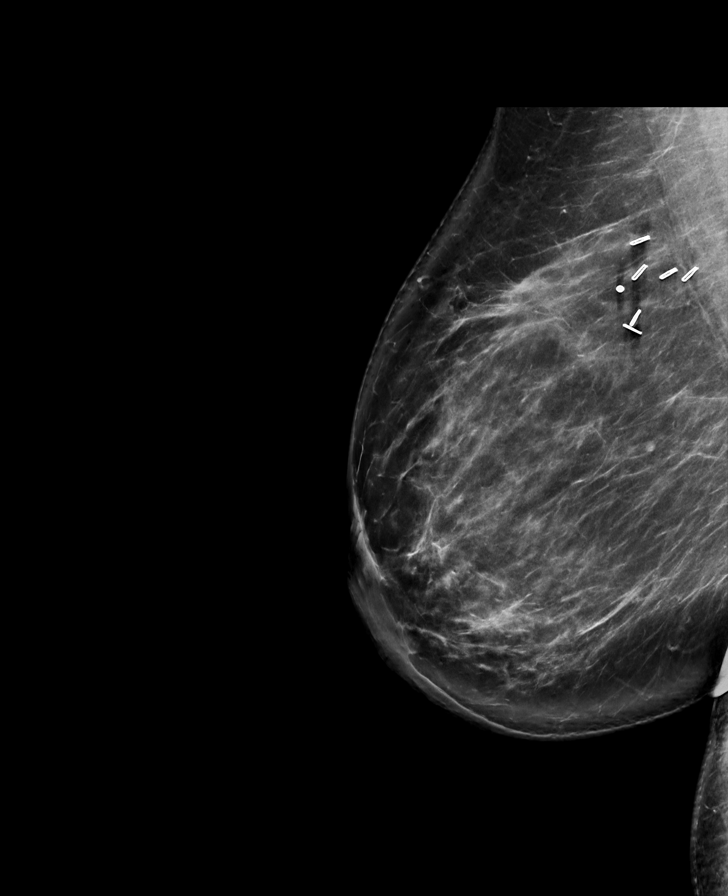

[8 of 40 positions shown; findings below may reference images not displayed]

ACR Breast Density Category b: There are scattered areas of
fibroglandular density.
FINDINGS: Mammogram:

Right breast: Spot 2D magnification views of the lumpectomy site
were performed in addition to standard views. There are expected
postsurgical changes including fat necrosis. A spot compression
tomosynthesis view of the inferior right breast was also performed
for a possible asymmetry seen on the MLO view which resolves on the
additional imaging, consistent with normal overlapping
fibroglandular tissue.

Left breast: No suspicious mass, distortion, or microcalcifications
are identified to suggest presence of malignancy.

On physical exam at the site of pain and possible mass reported by
the patient in the outer right breast I do not feel a fixed discrete
mass.

Ultrasound:

Targeted ultrasound performed throughout the outer aspect of the
right breast at the site of pain reported by the patient
demonstrating no cystic or solid mass. There is mild skin thickening
compatible with post radiation changes.
IMPRESSION: 1. Expected postsurgical changes in the right breast. No
mammographic or sonographic evidence of malignancy.

2. No new abnormality at the palpable/painful site of concern
reported by the patient in the outer right breast.

3.  No mammographic evidence of malignancy in the left breast.

RECOMMENDATION:
1. Recommend any further workup of the palpable/painful site of
concern reported by the patient be on a clinical basis.

2.  Screening mammogram in one year.(Code:[id number])

I have discussed the findings and recommendations with the patient.
If applicable, a reminder letter will be sent to the patient
regarding the next appointment.

BI-RADS CATEGORY  2: Benign.

## 2022-03-07 ENCOUNTER — Ambulatory Visit (HOSPITAL_BASED_OUTPATIENT_CLINIC_OR_DEPARTMENT_OTHER): Admit: 2022-03-07 | Discharge: 2022-03-07 | Disposition: A | Discharge status: Home / Self Care | Payer: Self-pay

## 2022-03-08 ENCOUNTER — Other Ambulatory Visit (HOSPITAL_BASED_OUTPATIENT_CLINIC_OR_DEPARTMENT_OTHER): Payer: Self-pay | Admitting: Medical Oncology

## 2022-03-19 ENCOUNTER — Other Ambulatory Visit: Payer: Self-pay

## 2022-04-12 ENCOUNTER — Ambulatory Visit (HOSPITAL_BASED_OUTPATIENT_CLINIC_OR_DEPARTMENT_OTHER): Payer: PPO

## 2022-04-12 ENCOUNTER — Ambulatory Visit (HOSPITAL_BASED_OUTPATIENT_CLINIC_OR_DEPARTMENT_OTHER): Payer: PPO | Attending: Hematology & Oncology | Admitting: Hematology & Oncology

## 2022-04-12 VITALS — BP 108/75 | HR 90 | Temp 98.4°F | Resp 14 | Ht 67.13 in | Wt 236.8 lb

## 2022-04-12 DIAGNOSIS — Z853 Personal history of malignant neoplasm of breast: Secondary | ICD-10-CM | POA: Insufficient documentation

## 2022-04-12 DIAGNOSIS — N6331 Unspecified lump in axillary tail of the right breast: Secondary | ICD-10-CM | POA: Insufficient documentation

## 2022-04-12 DIAGNOSIS — R519 Headache, unspecified: Secondary | ICD-10-CM | POA: Insufficient documentation

## 2022-04-12 DIAGNOSIS — E348 Other specified endocrine disorders: Secondary | ICD-10-CM | POA: Insufficient documentation

## 2022-04-12 NOTE — Progress Notes (Signed)
[place]    Hematology/Oncology New Patient Note      Today's Date: 04/12/22    Reason for Consult: history of breast cancer    HISTORY OF PRESENT ILLNESS: April Chen is a 46 year old female who presents with history of breast cancer.  She was initially diagnosed and treated in [place].  Her oncologist was Dr. Reynolds Bowl.  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     Oncology history, as per her prior oncology notes in Care Everywhere:    Ms. Higginbotham palpated a mass in her right breast in early July 2019.    At the breast center at [facility] in [place] on 06/24/2018 she had a bilateral diagnostic mammogram and ultrasound. There is noted to be a hypoechoic mass at the 10:00 right breast upper outer quadrant, no suspicious lymph nodes in the right axilla.    06/24/2018: Biopsy of the right breast. Pathology shows invasive ductal carcinoma, grade 2, ER/PR receptor 0% negative and proliferation marker Ki-67 80%. HER-2 amplified with ratios HER-2/CEP 17 signals 3.28 and average HER-2 copies per cell 4.10    She underwent neoadjuvant chemotherapy with trastuzumab, Pertuzumab and Taxol in [place] but only received 1 dose of chemotherapy. She did not tolerate this well.  She transitioned her care to [facility] and perhaps [facility] as well.     She underwent a lumpectomy on 07/22/2018. She then had adjuvant chemotherapy with Taxol, Herceptin and Perjeta from 09/14/2018 to 11/14/2018. She completed close to a year of Herceptin and Perjeta in the maintenance setting. She then received radiation therapy also at [facility].  Oncology Treatment Summary  1. Neoadjuvant THP 06/2018 x 1 dose only. She did not want any more docetaxel at that point.  2. Lumpectomy 07/22/2018. At [facility]  3. Adjuvant chemo paclitaxel with HP from 08/2018 until 10/2018. Stopped early due to  neuropathy. HP for an undetermined, but extensive period. Delivered at [facility].  3. XRT 01/2019. At [facility].    Imaging studies done there:  07/15/2020: Bone scan negative for osseous metastatic disease.  01/14/2020: CT scans of the chest, abdomen and pelvis show no evidence of metastatic disease.  01/14/2020: Right digital diagnostic mammogram evaluating diffuse right breast pain. Impression is right breast conservation changes are benign and no evidence of malignancy in the right breast. Recommendation diagnostic mammogram in August 2021.      April Chen has recently seen oncology at [facility], Dr. Jule Economy.  She notes feeling a new lump in her right axilla.  She proceeded with diagnostic mammogram and ultrasound on 04/11/22, which found a 5 mm right axillary mass located 11 cm from the nipple.  It could be post-procedure change, but biopsy was recommended.  In the left breast, there was a "probably benign lower outer quadrant focal asymmetry without suspicious sonographic correlate."  A 32-monthfollow-up left breast mammogram was recommended.    April Chen some more questions about various HER-2 therapies, including neratinib and Enhertu, as well as when to do full body scans.          REVIEW OF SYSTEMS:  14 point ROS was reviewed and is negative other than as noted above in HPI.      HOME MEDICATIONS:  Current Outpatient Medications   Medication Sig Dispense Refill   . ASHWAGANDHA OR Take by mouth.     .Marland Kitchen  Bilberry (Vaccinium myrtillus) (BILBERRY OR) Take by mouth.     Marland Kitchen BIOTIN OR Take by mouth.     . empagliflozin 25 MG tablet Take 1 tablet (25 mg) by mouth.     . Other Meds (See Sig/Instructions) Ip6     . Other Meds (See Sig/Instructions) PRIMROSE     . Other Meds (See Sig/Instructions) LIONS MANE     . SPIRULINA OR Take by mouth.     . ST JOHNS WORT OR Take by mouth.     . Turmeric (Curcuma Longa) (Curcumin) powder        No current facility-administered medications for this visit.          ALLERGIES:  Review of patient's allergies indicates:  No Known Allergies      PAST MEDICAL HISTORY:  Diabetes  Right breast cancer      PAST SURGICAL HISTORY:  Lumpectomy in August 2019      SOCIAL HISTORY:  Social History     Socioeconomic History   . Marital status: Single     Spouse name: Not on file   . Number of children: Not on file   . Years of education: Not on file   . Highest education level: Not on file   Occupational History   . Not on file   Tobacco Use   . Smoking status: Not on file   . Smokeless tobacco: Not on file   Substance and Sexual Activity   . Alcohol use: Not on file   . Drug use: Not on file   . Sexual activity: Not on file   Other Topics Concern   . Not on file   Social History Narrative   . Not on file     Social Determinants of Health     Financial Resource Strain: Not on file   Food Insecurity: Not on file   Transportation Needs: Not on file   Physical Activity: Not on file   Stress: Not on file   Social Connections: Not on file   Intimate Partner Violence: Not on file   Housing Stability: Not on file         FAMILY HISTORY:  None      PHYSICAL EXAM:  Vital signs: BP 108/75   Pulse 90   Temp 36.9 C (Oral)   Resp 14   Ht 5' 7.13" (1.705 m)   Wt (!) 107.4 kg (236 lb 12.4 oz)   SpO2 97%   BMI 36.94 kg/m   ECOG: 0  GENERAL/CONSTITUTIONAL: No acute distress.  EYES: No scleral icterus.  LYMPH: No anterior cervical, posterior cervical, supraclavicular, axillary or inguinal adenopathy.  RESPIRATORY: Clear to auscultation bilaterally. No crackles or wheezing.  CARDIOVASCULAR: Regular rate and rhythm without murmurs, gallops, or rubs.  BREAST: Right-firmness at the right axilla and right breast around lumpectomy site. Left-no  palpable mass, discharge, rash, or axillary lymphadenopathy.   GASTROINTESTINAL: No tenderness. The patient has normal bowel sounds. No guarding. No distention.  MUSCULOSKELETAL: Warm and well-perfused, no cyanosis, clubbing, or edema.  NEUROLOGIC: Alert,  oriented, answers questions appropriately.        LABS:  None today.      IMAGING:  MRI brain 03/29/22:    1. No evidence of metastatic disease to the brain given noncontrast technique.   2. 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 Follow-up MRI in 1 year may be performed to establish stability.  Bilateral diagnostic mammogram and ultrasound  04/11/22:  Impression    Right breast: BI-RADS 4 - Suspicious   Left breast: BI-RADS 3 - Probably Benign     RECOMMENDATION:   Please request prior imaging for comparison. If received, an addendum can   be issued.     Right breast: Ultrasound guided biopsy is recommended for further   evaluation of suspicious 5 mm right axillary mass located 11 cm from the   nipple. This may represent post procedural change.   Left breast: A 6 month follow-up left breast mammogram is recommended for   follow-up of probably benign lower outer quadrant focal asymmetry without   suspicious sonographic correlate.         ASSESSMENT/PLAN: April Chen is a 46 year old female with:    1) Right breast cancer: Stage IIA cT2,cN0 ER/PR negative HER2 amplified IDC of the right breast.  She is currently on surveillance.  She saw oncologist at [facility], and had felt a new lump at axilla, so she underwent mammogram and ultrasound yesterday.  She is getting biopsy a 5 mm right axillary mass located 11 cm from the nipple, next week.  It could be scar tissue, but there were no prior ultrasounds to compare to, as they were done in [place].  She had some further questions regarding various HER-2 therapies and when full body scans are indicated, as well as symptoms to watch for.    -will follow-up on the biopsy results from next week. If negative, she will continue with surveillance, and RTC in 3 months for exam.  If there is recurrence of her cancer, then I will see her back sooner to discuss plan.    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  The MRI was done without contrast.  She has persistent "shooting headaches."  She would like to do the MRI with contrast to make sure there is no metastasis - ordered.    4) Diabetes:  -following with PCP      Lyndon Code, MD  Hematology/Oncology    Total time spent on day of visit, including review of tests, obtaining/reviewing separately  obtained history, ordering medications/tests/procedures, communicating with PCP/consultants,  and documenting in [place] minutes

## 2022-04-24 ENCOUNTER — Telehealth (HOSPITAL_BASED_OUTPATIENT_CLINIC_OR_DEPARTMENT_OTHER): Payer: Self-pay

## 2022-04-24 ENCOUNTER — Telehealth (HOSPITAL_BASED_OUTPATIENT_CLINIC_OR_DEPARTMENT_OTHER): Payer: Self-pay | Admitting: Hematology & Oncology

## 2022-04-24 NOTE — Telephone Encounter (Signed)
April Chen seeing her oncologist at [facility] on Thursday 04/26/22 to discuss results.

## 2022-04-24 NOTE — Telephone Encounter (Signed)
Patient called with results from [facility]  Unfortunately I was not able to access results or provide any information about the results because I cannot see results  Advised her to check back with Dr. Radene Knee tomorrow  Biopsy results from [facility]

## 2022-04-24 NOTE — Telephone Encounter (Signed)
Per Care Everywhere- biopsy was done at [facility] 04/19/22 by Dr. Truett Mainland. No pathology in Care Everywhere. [place] at [phone]. Pathology is still in process. It should be available in review in Care Everywhere once completed.     Next clinic visit 07/04/22

## 2022-05-07 ENCOUNTER — Encounter (HOSPITAL_BASED_OUTPATIENT_CLINIC_OR_DEPARTMENT_OTHER): Payer: Self-pay | Admitting: Hematology & Oncology

## 2022-07-04 ENCOUNTER — Ambulatory Visit (HOSPITAL_BASED_OUTPATIENT_CLINIC_OR_DEPARTMENT_OTHER): Payer: PPO | Attending: Hematology & Oncology | Admitting: Hematology & Oncology

## 2022-07-04 VITALS — BP 113/77 | HR 78 | Temp 98.8°F | Resp 16 | Wt 241.0 lb

## 2022-07-04 DIAGNOSIS — R519 Headache, unspecified: Secondary | ICD-10-CM | POA: Insufficient documentation

## 2022-07-04 DIAGNOSIS — D354 Benign neoplasm of pineal gland: Secondary | ICD-10-CM | POA: Insufficient documentation

## 2022-07-04 DIAGNOSIS — E119 Type 2 diabetes mellitus without complications: Secondary | ICD-10-CM | POA: Insufficient documentation

## 2022-07-04 DIAGNOSIS — Z08 Encounter for follow-up examination after completed treatment for malignant neoplasm: Secondary | ICD-10-CM | POA: Insufficient documentation

## 2022-07-04 DIAGNOSIS — N6323 Unspecified lump in the left breast, lower outer quadrant: Secondary | ICD-10-CM | POA: Insufficient documentation

## 2022-07-04 DIAGNOSIS — Z853 Personal history of malignant neoplasm of breast: Secondary | ICD-10-CM | POA: Insufficient documentation

## 2022-07-04 NOTE — Progress Notes (Signed)
[facility]    Hematology/Oncology Established Patient Note      Today's Date: 07/04/22    Reason for Follow-up: history of breast cancer    HISTORY OF PRESENT ILLNESS: April Chen is a 46 year old female who presents with history of breast cancer.  She was initially diagnosed and treated in [place].  Her oncologist was Dr. Reynolds Bowl.  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     Oncology history, as per her prior oncology notes in Care Everywhere:    April Chen palpated a mass in her right breast in early July 2019.    At the breast center at [facility] in [place] on 06/24/2018 she had a bilateral diagnostic mammogram and ultrasound. There is noted to be a hypoechoic mass at the 10:00 right breast upper outer quadrant, no suspicious lymph nodes in the right axilla.    06/24/2018: Biopsy of the right breast. Pathology shows invasive ductal carcinoma, grade 2, ER/PR receptor 0% negative and proliferation marker Ki-67 80%. HER-2 amplified with ratios HER-2/CEP 17 signals 3.28 and average HER-2 copies per cell 4.10    She underwent neoadjuvant chemotherapy with trastuzumab, Pertuzumab and Taxol in [place] but only received 1 dose of chemotherapy. She did not tolerate this well.  She transitioned her care to [facility] and perhaps [facility] as well.     She underwent a lumpectomy on 07/22/2018. She then had adjuvant chemotherapy with Taxol, Herceptin and Perjeta from 09/14/2018 to 11/14/2018. She completed close to a year of Herceptin and Perjeta in the maintenance setting. She then received radiation therapy also at [facility].  Oncology Treatment Summary  1. Neoadjuvant THP 06/2018 x 1 dose only. She did not want any more docetaxel at that point.  2. Lumpectomy 07/22/2018. At [facility]  3. Adjuvant chemo paclitaxel with HP from 08/2018 until 10/2018. Stopped early due to  neuropathy. HP for an undetermined, but extensive period. Delivered at [facility].  3. XRT 01/2019. At [facility].    Imaging studies done there:  07/15/2020: Bone scan negative for osseous metastatic disease.  01/14/2020: CT scans of the chest, abdomen and pelvis show no evidence of metastatic disease.  01/14/2020: Right digital diagnostic mammogram evaluating diffuse right breast pain. Impression is right breast conservation changes are benign and no evidence of malignancy in the right breast. Recommendation diagnostic mammogram in August 2021.      April Chen saw oncology at [facility], Dr. Jule Economy.  She notes feeling a new lump in her right axilla.  She proceeded with diagnostic mammogram and ultrasound on 04/11/22, which found a 5 mm right axillary mass located 11 cm from the nipple.  It could be post-procedure change, but biopsy was recommended.  Biopsy was done on 04/19/22, which was negative for atypical ductal hyperplasia, DCIS, or invasive carcinoma.  There were small fragments of lymph node tissue, dense fibrous tissue, and adipose tissue.      In the left breast on mammogram and ultrasound, there was a "probably benign lower outer quadrant focal asymmetry without suspicious sonographic correlate."  A 70-monthfollow-up left breast mammogram was recommended.      INTERIM HISTORY: April Chen here for follow-up.  She is doing well.        REVIEW OF SYSTEMS:  14 point ROS was reviewed and is negative other than as noted above in HPI.  HOME MEDICATIONS:  Current Outpatient Medications   Medication Sig Dispense Refill    ASHWAGANDHA OR Take by mouth.      Bilberry (Vaccinium myrtillus) (BILBERRY OR) Take by mouth.      BIOTIN OR Take by mouth.      empagliflozin 25 MG tablet Take 1 tablet (25 mg) by mouth.      Other Meds (See Sig/Instructions) Ip6      Other Meds (See Sig/Instructions) PRIMROSE      Other Meds (See Sig/Instructions) LIONS MANE      SPIRULINA OR Take by mouth.      ST JOHNS WORT OR Take by  mouth.      Turmeric (Curcuma Longa) (Curcumin) powder        No current facility-administered medications for this visit.         ALLERGIES:  Review of patient's allergies indicates:  No Known Allergies      PAST MEDICAL HISTORY:  Diabetes  Right breast cancer      PAST SURGICAL HISTORY:  Lumpectomy in August 2019      SOCIAL HISTORY:  Social History     Socioeconomic History    Marital status: Single     Spouse name: Not on file    Number of children: Not on file    Years of education: Not on file    Highest education level: Not on file   Occupational History    Not on file   Tobacco Use    Smoking status: Not on file    Smokeless tobacco: Not on file   Substance and Sexual Activity    Alcohol use: Not on file    Drug use: Not on file    Sexual activity: Not on file   Other Topics Concern    Not on file   Social History Narrative    Not on file     Social Determinants of Health     Financial Resource Strain: Not on file   Food Insecurity: Not on file   Transportation Needs: Not on file   Physical Activity: Not on file   Stress: Not on file   Social Connections: Not on file   Intimate Partner Violence: Not on file   Housing Stability: Not on file         FAMILY HISTORY:  None      PHYSICAL EXAM:  Vital signs: BP 113/77   Pulse 78   Temp 37.1 C (Oral)   Resp 16   Wt (!) 109.3 kg (240 lb 15.4 oz)   SpO2 98%   BMI 37.60 kg/m   ECOG: 0  GENERAL/CONSTITUTIONAL: No acute distress.  EYES: No scleral icterus.  LYMPH: No anterior cervical, posterior cervical, supraclavicular, or axillary adenopathy.  RESPIRATORY: Clear to auscultation bilaterally. No crackles or wheezing.  CARDIOVASCULAR: Regular rate and rhythm without murmurs, gallops, or rubs.  BREAST: Right-s/p lumpectomy; no palpable mass, discharge, rash, or axillary lymphadenopathy. Left-no  palpable mass, discharge, rash, or axillary lymphadenopathy.   GASTROINTESTINAL: No tenderness. No guarding. No distention.  MUSCULOSKELETAL: Warm and well-perfused, no  cyanosis, clubbing or edema.  NEUROLOGIC: Alert, oriented, answers questions appropriately.        IMAGING:  MRI brain 05/05/22:  1. 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 Consider 12 month MRI follow-up to evaluate stability, or reimage sooner for new or progressive symptoms.   2. Normal appearance of the brain, no acute intracranial abnormality.       Bilateral diagnostic mammogram and  ultrasound  04/11/22:  Impression    Right breast: BI-RADS 4 - Suspicious   Left breast: BI-RADS 3 - Probably Benign     RECOMMENDATION:   Please request prior imaging for comparison.  If received, an addendum can   be issued.     Right breast: Ultrasound guided biopsy is recommended for further   evaluation of suspicious 5 mm right axillary mass located 11 cm from the   nipple.  This may represent post procedural change.   Left breast: A 6 month follow-up left breast mammogram is recommended for   follow-up of probably benign lower outer quadrant focal asymmetry without   suspicious sonographic correlate.         ASSESSMENT/PLAN: April Chen is a 46 year old female with:    1) Right breast cancer: Stage IIA cT2,cN0 ER/PR negative HER2 amplified IDC of the right breast.  She is currently on surveillance.  There has been no evidence of recurrence so far.  She had biopsy done in May 2023 of suspicious 5 mm right axillary mass, which was benign.    -RTC in 6 months for follow-up    2) Left breast: ultrasound from 04/11/22 showed a "probably benign lower outer quadrant focal asymmetry without suspicious sonographic correlate."  Follow-up left breast mammogram in 6 months was recommended.  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   -MRI brain in June 2024    4) Diabetes:  -following with PCP      Lyndon Code, MD  Hematology/Oncology    Total time spent on day  of visit, including review of tests, obtaining/reviewing separately  obtained history, ordering medications/tests/procedures, communicating with PCP/consultants,  and documenting in [place] minutes

## 2022-07-16 ENCOUNTER — Encounter (HOSPITAL_BASED_OUTPATIENT_CLINIC_OR_DEPARTMENT_OTHER): Payer: Self-pay

## 2022-08-31 ENCOUNTER — Encounter (HOSPITAL_BASED_OUTPATIENT_CLINIC_OR_DEPARTMENT_OTHER): Payer: Self-pay

## 2023-01-16 ENCOUNTER — Ambulatory Visit (HOSPITAL_BASED_OUTPATIENT_CLINIC_OR_DEPARTMENT_OTHER): Payer: PPO | Attending: Hematology & Oncology | Admitting: Hematology & Oncology

## 2023-01-16 VITALS — BP 115/89 | HR 72 | Temp 97.9°F

## 2023-01-16 DIAGNOSIS — C50411 Malignant neoplasm of upper-outer quadrant of right female breast: Secondary | ICD-10-CM | POA: Insufficient documentation

## 2023-01-16 DIAGNOSIS — Z1231 Encounter for screening mammogram for malignant neoplasm of breast: Secondary | ICD-10-CM

## 2023-01-16 DIAGNOSIS — E348 Other specified endocrine disorders: Secondary | ICD-10-CM

## 2023-01-16 DIAGNOSIS — N6323 Unspecified lump in the left breast, lower outer quadrant: Secondary | ICD-10-CM

## 2023-01-16 DIAGNOSIS — Z853 Personal history of malignant neoplasm of breast: Secondary | ICD-10-CM

## 2023-01-16 DIAGNOSIS — E119 Type 2 diabetes mellitus without complications: Secondary | ICD-10-CM | POA: Insufficient documentation

## 2023-01-16 DIAGNOSIS — Z171 Estrogen receptor negative status [ER-]: Secondary | ICD-10-CM | POA: Insufficient documentation

## 2023-01-16 DIAGNOSIS — M545 Low back pain, unspecified: Secondary | ICD-10-CM | POA: Insufficient documentation

## 2023-01-16 NOTE — Progress Notes (Signed)
Ethel    Hematology/Oncology Established Patient Note      Today's Date: 01/16/23    Reason for Follow-up: history of breast cancer    HISTORY OF PRESENT ILLNESS: Deryl Umphlett is a 47 year old female who presents with history of breast cancer.  She was initially diagnosed and treated in [place].  Her oncologist was Dr. Reynolds Bowl.  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     Oncology history, as per her prior oncology notes in Care Everywhere:    Ms. Kiraly palpated a mass in her right breast in early July 2019.    At the breast center at [facility] in [place] on 06/24/2018 she had a bilateral diagnostic mammogram and ultrasound. There is noted to be a hypoechoic mass at the 10:00 right breast upper outer quadrant, no suspicious lymph nodes in the right axilla.    06/24/2018: Biopsy of the right breast. Pathology shows invasive ductal carcinoma, grade 2, ER/PR receptor 0% negative and proliferation marker Ki-67 80%. HER-2 amplified with ratios HER-2/CEP 17 signals 3.28 and average HER-2 copies per cell 4.10    She underwent neoadjuvant chemotherapy with trastuzumab, Pertuzumab and Taxol in [place] but only received 1 dose of chemotherapy. She did not tolerate this well.  She transitioned her care to [facility] and perhaps [facility] as well.     She underwent a lumpectomy on 07/22/2018. She then had adjuvant chemotherapy with Taxol, Herceptin and Perjeta from 09/14/2018 to 11/14/2018. She completed close to a year of Herceptin and Perjeta in the maintenance setting. She then received radiation therapy also at [facility].  Oncology Treatment Summary  1. Neoadjuvant THP 06/2018 x 1 dose only. She did not want any more docetaxel at that point.  2. Lumpectomy 07/22/2018. At [facility]  3. Adjuvant chemo paclitaxel with HP from 08/2018 until 10/2018. Stopped early due to  neuropathy. HP for an undetermined, but extensive period. Delivered at [facility].  3. XRT 01/2019. At [facility].    Imaging studies done there:  07/15/2020: Bone scan negative for osseous metastatic disease.  01/14/2020: CT scans of the chest, abdomen and pelvis show no evidence of metastatic disease.  01/14/2020: Right digital diagnostic mammogram evaluating diffuse right breast pain. Impression is right breast conservation changes are benign and no evidence of malignancy in the right breast. Recommendation diagnostic mammogram in August 2021.      Apolonio Schneiders saw oncology at [facility], Dr. Jule Economy.  She notes feeling a new lump in her right axilla.  She proceeded with diagnostic mammogram and ultrasound on 04/11/22, which found a 5 mm right axillary mass located 11 cm from the nipple.  It could be post-procedure change, but biopsy was recommended.  Biopsy was done on 04/19/22, which was negative for atypical ductal hyperplasia, DCIS, or invasive carcinoma.  There were small fragments of lymph node tissue, dense fibrous tissue, and adipose tissue.      In the left breast on mammogram and ultrasound, there was a "probably benign lower outer quadrant focal asymmetry without suspicious sonographic correlate."  A 27-monthfollow-up left breast mammogram was recommended.      INTERIM HISTORY: RAdeleiis here for follow-up.  She was over 20 minutes late to appointment.  She has concerns about having low back pain.  She doesn't really remember how long she has had it, but after thinking about  it further, it has perhaps been a month and a half.  She says that she felt it last night, but doesn't currently have pain this morning.  She did not get her mammogram done in November 2023 because she really doesn't like doing mammograms, since they are very uncomfortable.            REVIEW OF SYSTEMS:  14 point ROS was reviewed and is negative other than as noted above in HPI.      HOME MEDICATIONS:  Current Outpatient Medications    Medication Sig Dispense Refill    ASHWAGANDHA OR Take by mouth.      Bilberry (Vaccinium myrtillus) (BILBERRY OR) Take by mouth.      BIOTIN OR Take by mouth. (Patient not taking: Reported on 01/16/2023)      empagliflozin 25 MG tablet Take 1 tablet (25 mg) by mouth.      Other Meds (See Sig/Instructions) Ip6      Other Meds (See Sig/Instructions) PRIMROSE      Other Meds (See Sig/Instructions) LIONS MANE      SPIRULINA OR Take by mouth.      ST JOHNS WORT OR Take by mouth.      Turmeric (Curcuma Longa) (Curcumin) powder        No current facility-administered medications for this visit.         ALLERGIES:  Review of patient's allergies indicates:  No Known Allergies      PAST MEDICAL HISTORY:  Diabetes  Right breast cancer      PAST SURGICAL HISTORY:  Lumpectomy in August 2019      SOCIAL HISTORY:  Social History     Socioeconomic History    Marital status: Single     Spouse name: Not on file    Number of children: Not on file    Years of education: Not on file    Highest education level: Not on file   Occupational History    Not on file   Tobacco Use    Smoking status: Not on file    Smokeless tobacco: Not on file   Substance and Sexual Activity    Alcohol use: Not on file    Drug use: Not on file    Sexual activity: Not on file   Other Topics Concern    Not on file   Social History Narrative    Not on file     Social Determinants of Health     Financial Resource Strain: Not on file   Food Insecurity: Not on file   Transportation Needs: Not on file   Physical Activity: Not on file   Stress: Not on file   Social Connections: Not on file   Intimate Partner Violence: Not on file   Housing Stability: Not on file         FAMILY HISTORY:  Family History       No data available        None      PHYSICAL EXAM:  Vital signs: BP 115/89   Pulse 72   Temp 36.6 C (Oral)   SpO2 98%   ECOG: 0  GENERAL/CONSTITUTIONAL: No acute distress.  EYES: No scleral icterus.  LYMPH: No anterior cervical, posterior cervical,  supraclavicular, or axillary adenopathy.  RESPIRATORY: Clear to auscultation bilaterally. No crackles or wheezing.  CARDIOVASCULAR: Regular rate and rhythm without murmurs, gallops, or rubs.  BREAST: Right-s/p lumpectomy; no palpable mass, discharge, rash, or axillary lymphadenopathy. Left-no  palpable mass,  discharge, rash, or axillary lymphadenopathy.   GASTROINTESTINAL: No tenderness. No guarding.   MUSCULOSKELETAL: Warm and well-perfused, no cyanosis, clubbing or edema.  No back tenderness.  NEUROLOGIC: Alert, oriented, answers questions appropriately.        IMAGING:  MRI brain 05/05/22:  1. 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 Consider 12 month MRI follow-up to evaluate stability, or reimage sooner for new or progressive symptoms.   2. Normal appearance of the brain, no acute intracranial abnormality.       Bilateral diagnostic mammogram and ultrasound  04/11/22:  Impression    Right breast: BI-RADS 4 - Suspicious   Left breast: BI-RADS 3 - Probably Benign     RECOMMENDATION:   Please request prior imaging for comparison.  If received, an addendum can   be issued.     Right breast: Ultrasound guided biopsy is recommended for further   evaluation of suspicious 5 mm right axillary mass located 11 cm from the   nipple.  This may represent post procedural change.   Left breast: A 6 month follow-up left breast mammogram is recommended for   follow-up of probably benign lower outer quadrant focal asymmetry without   suspicious sonographic correlate.         ASSESSMENT/PLAN: Jesselle Sandi is a 47 year old female with:    1) Right breast cancer: Stage IIA cT2,cN0 ER/PR negative HER2 amplified IDC of the right breast.  She is currently on surveillance.  There has been no evidence of recurrence so far.  She had biopsy done in May 2023 of suspicious 5 mm right axillary mass, which was benign.    -annual bilateral mammogram ordered for May 2024  -on every 6 month follow-up's.  She would be 5-years out by  August 2024.  At that point, can consider spacing out to annual follow-up's or discharging back to PCP    2) Left breast: ultrasound from 04/11/22 showed a "probably benign lower outer quadrant focal asymmetry without suspicious sonographic correlate."  Follow-up left breast mammogram in 6 months was recommended.  -left diagnostic mammogram ordered for November 2023.  However, Shadai did not get this done because she does not like doing mammograms.  After discussion, she says that she is willing to do the annual mammogram in May.      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   -MRI brain in June 2024 - ordered    4) Low back pain: unlikely to be due to metastases, but with her breast cancer diagnosis, she is requesting imaging.  Reasonable to do CT c/a/p and bone scan to rule out recurrence/metastases.    -CT c/a/p  -bone scan  -follow-up after imaging is done to review results    5) Diabetes:  -following with PCP      Lyndon Code, MD  Hematology/Oncology    Total time spent on day of visit, including review of tests, obtaining/reviewing separately  obtained history, ordering medications/tests/procedures, communicating with PCP/consultants,  and documenting in [place] minutes

## 2023-01-17 ENCOUNTER — Encounter (HOSPITAL_BASED_OUTPATIENT_CLINIC_OR_DEPARTMENT_OTHER): Payer: Self-pay

## 2023-02-21 ENCOUNTER — Ambulatory Visit (HOSPITAL_BASED_OUTPATIENT_CLINIC_OR_DEPARTMENT_OTHER): Payer: PPO | Admitting: Hematology & Oncology

## 2023-03-13 ENCOUNTER — Encounter (HOSPITAL_BASED_OUTPATIENT_CLINIC_OR_DEPARTMENT_OTHER): Payer: Self-pay

## 2023-03-13 ENCOUNTER — Telehealth (HOSPITAL_BASED_OUTPATIENT_CLINIC_OR_DEPARTMENT_OTHER): Payer: Self-pay | Admitting: Hematology & Oncology

## 2023-03-13 NOTE — Telephone Encounter (Signed)
We have attempted to contact patient on 3/27, 4/3, 4/10    To date no callback    Letter sent via MyChart and mail (see below)    Cancelling scheduling request and will reinstate if patient calls back.        The purpose of this letter is to remind you that it is time to schedule your follow up appointment with Dr. Cherly Hensen.     We have made several attempts to contact you by phone at [phone] , but have been unsuccessful in reaching you. At your earliest convenience please call [phone], option [phone] then select your provider to speak with a Patient Care Coordinator to schedule your appointment. Please let us know if your insurance, address, or contact information has been changed.    We look forward to seeing you soon and appreciate you choosing [facility].      Sincerely,      [facility] Staff and Providers

## 2023-04-18 ENCOUNTER — Telehealth (HOSPITAL_BASED_OUTPATIENT_CLINIC_OR_DEPARTMENT_OTHER): Payer: Self-pay | Admitting: Hematology & Oncology

## 2023-04-18 NOTE — Telephone Encounter (Signed)
Called to check in and see if pt was able to get her annual mammogram done this month. Left a voice mail asking for a return call to clinic or if she could send a mychart message with this information.
